# Patient Record
Sex: Female | Born: 1955
Health system: Southern US, Community
[De-identification: ages and names within clinical notes are randomized; demographics above are authoritative.]

## PROBLEM LIST (undated history)

## (undated) DIAGNOSIS — F419 Anxiety disorder, unspecified: Secondary | ICD-10-CM

## (undated) DIAGNOSIS — E559 Vitamin D deficiency, unspecified: Secondary | ICD-10-CM

## (undated) DIAGNOSIS — E039 Hypothyroidism, unspecified: Secondary | ICD-10-CM

## (undated) HISTORY — PX: WISDOM TOOTH EXTRACTION: SHX21

## (undated) HISTORY — DX: Anxiety disorder, unspecified: F41.9

---

## 1997-03-07 ENCOUNTER — Ambulatory Visit (HOSPITAL_COMMUNITY): Admission: RE | Admit: 1997-03-07 | Discharge: 1997-03-07 | Payer: Self-pay | Admitting: Obstetrics and Gynecology

## 1998-08-20 ENCOUNTER — Encounter: Payer: Self-pay | Admitting: Obstetrics and Gynecology

## 1998-08-20 ENCOUNTER — Ambulatory Visit (HOSPITAL_COMMUNITY): Admission: RE | Admit: 1998-08-20 | Discharge: 1998-08-20 | Payer: Self-pay | Admitting: Internal Medicine

## 1998-11-03 ENCOUNTER — Other Ambulatory Visit: Admission: RE | Admit: 1998-11-03 | Discharge: 1998-11-03 | Payer: Self-pay | Admitting: Obstetrics and Gynecology

## 2000-05-06 ENCOUNTER — Other Ambulatory Visit: Admission: RE | Admit: 2000-05-06 | Discharge: 2000-05-06 | Payer: Self-pay | Admitting: Obstetrics and Gynecology

## 2001-05-08 ENCOUNTER — Other Ambulatory Visit: Admission: RE | Admit: 2001-05-08 | Discharge: 2001-05-08 | Payer: Self-pay | Admitting: Obstetrics and Gynecology

## 2002-05-11 ENCOUNTER — Other Ambulatory Visit: Admission: RE | Admit: 2002-05-11 | Discharge: 2002-05-11 | Payer: Self-pay | Admitting: Obstetrics and Gynecology

## 2003-05-20 ENCOUNTER — Other Ambulatory Visit: Admission: RE | Admit: 2003-05-20 | Discharge: 2003-05-20 | Payer: Self-pay | Admitting: Obstetrics and Gynecology

## 2003-05-30 ENCOUNTER — Encounter: Admission: RE | Admit: 2003-05-30 | Discharge: 2003-05-30 | Payer: Self-pay | Admitting: Obstetrics and Gynecology

## 2004-07-16 ENCOUNTER — Other Ambulatory Visit: Admission: RE | Admit: 2004-07-16 | Discharge: 2004-07-16 | Payer: Self-pay | Admitting: Obstetrics and Gynecology

## 2010-01-11 HISTORY — PX: ENDOMETRIAL BIOPSY: SHX622

## 2010-10-07 ENCOUNTER — Ambulatory Visit (INDEPENDENT_AMBULATORY_CARE_PROVIDER_SITE_OTHER): Payer: PRIVATE HEALTH INSURANCE | Admitting: Internal Medicine

## 2010-10-07 ENCOUNTER — Encounter: Payer: Self-pay | Admitting: Internal Medicine

## 2010-10-07 DIAGNOSIS — E559 Vitamin D deficiency, unspecified: Secondary | ICD-10-CM | POA: Insufficient documentation

## 2010-10-07 DIAGNOSIS — E663 Overweight: Secondary | ICD-10-CM

## 2010-10-07 DIAGNOSIS — R5383 Other fatigue: Secondary | ICD-10-CM

## 2010-10-07 DIAGNOSIS — Z6825 Body mass index (BMI) 25.0-25.9, adult: Secondary | ICD-10-CM

## 2010-10-07 DIAGNOSIS — D649 Anemia, unspecified: Secondary | ICD-10-CM

## 2010-10-07 DIAGNOSIS — E669 Obesity, unspecified: Secondary | ICD-10-CM

## 2010-10-07 LAB — COMPREHENSIVE METABOLIC PANEL
ALT: 12 U/L (ref 0–35)
Albumin: 3.9 g/dL (ref 3.5–5.2)
CO2: 28 mEq/L (ref 19–32)
Calcium: 9.4 mg/dL (ref 8.4–10.5)
Chloride: 108 mEq/L (ref 96–112)
GFR: 65.6 mL/min (ref >60.00)
Glucose, Bld: 90 mg/dL (ref 70–99)
Potassium: 4.4 mEq/L (ref 3.5–5.1)
Sodium: 143 mEq/L (ref 135–145)
Total Bilirubin: 0.7 mg/dL (ref 0.3–1.2)
Total Protein: 7.4 g/dL (ref 6.0–8.3)

## 2010-10-07 LAB — CBC WITH DIFFERENTIAL/PLATELET
Basophils Absolute: 0.1 10*3/uL (ref 0.0–0.1)
Basophils Relative: 1.4 % (ref 0.0–3.0)
Eosinophils Absolute: 0.2 10*3/uL (ref 0.0–0.7)
Eosinophils Relative: 3.8 % (ref 0.0–5.0)
HCT: 39 % (ref 36.0–46.0)
Hemoglobin: 12.6 g/dL (ref 12.0–15.0)
Lymphocytes Relative: 31 % (ref 12.0–46.0)
Lymphs Abs: 1.7 10*3/uL (ref 0.7–4.0)
MCHC: 32.4 g/dL (ref 30.0–36.0)
MCV: 93.4 fl (ref 78.0–100.0)
Monocytes Absolute: 0.4 10*3/uL (ref 0.1–1.0)
Monocytes Relative: 7.8 % (ref 3.0–12.0)
Neutro Abs: 3.1 10*3/uL (ref 1.4–7.7)
Neutrophils Relative %: 56 % (ref 43.0–77.0)
Platelets: 239 10*3/uL (ref 150.0–400.0)
RBC: 4.18 Mil/uL (ref 3.87–5.11)
RDW: 14.1 % (ref 11.5–14.6)
WBC: 5.6 10*3/uL (ref 4.5–10.5)

## 2010-10-07 LAB — FERRITIN: Ferritin: 26.2 ng/mL (ref 10.0–291.0)

## 2010-10-07 LAB — IRON AND TIBC
%SAT: 21 % (ref 20–55)
Iron: 77 ug/dL (ref 42–145)
TIBC: 370 ug/dL (ref 250–470)
UIBC: 293 ug/dL (ref 125–400)

## 2010-10-07 LAB — VITAMIN B12: Vitamin B-12: 348 pg/mL (ref 211–911)

## 2010-10-07 LAB — LIPID PANEL: Cholesterol: 192 mg/dL (ref 0–200)

## 2010-10-07 LAB — TSH: TSH: 2.51 u[IU]/mL (ref 0.35–5.50)

## 2010-10-07 NOTE — Patient Instructions (Addendum)
Read about the Renato Shin Weight Loss method.  available at [facility]   We will call with the results of your labs

## 2010-10-07 NOTE — Progress Notes (Signed)
  Subjective:    Patient ID: Alicia Brady, female    DOB: 04-11-1955, 55 y.o.   MRN: [id number]  HPI  55 yo white female with no significant PMH presents to establish care .  She has no specific complaints today, but is interested in losing weight.   Past Medical History  Diagnosis Date  . Anxiety     managed with alprazolam  . Vitamin D deficiency     No current outpatient prescriptions on file prior to visit.    Review of Systems  Constitutional: Positive for fatigue. Negative for fever, chills and unexpected weight change.  HENT: Negative for hearing loss, ear pain, nosebleeds, congestion, sore throat, facial swelling, rhinorrhea, sneezing, mouth sores, trouble swallowing, neck pain, neck stiffness, voice change, postnasal drip, sinus pressure, tinnitus and ear discharge.   Eyes: Negative for pain, discharge, redness and visual disturbance.  Respiratory: Positive for chest tightness. Negative for cough, shortness of breath, wheezing and stridor.   Cardiovascular: Negative for chest pain, palpitations and leg swelling.  Gastrointestinal: Positive for constipation.  Genitourinary: Positive for enuresis.  Musculoskeletal: Negative for myalgias and arthralgias.  Skin: Negative for color change and rash.  Neurological: Negative for dizziness, weakness, light-headedness and headaches.  Hematological: Negative for adenopathy.  Psychiatric/Behavioral: Positive for sleep disturbance. The patient is nervous/anxious.   All other systems reviewed and are negative.       Objective:   Physical Exam  Constitutional: She is oriented to person, place, and time. She appears well-developed and well-nourished.  HENT:  Mouth/Throat: Oropharynx is clear and moist.  Eyes: EOM are normal. Pupils are equal, round, and reactive to light. No scleral icterus.  Neck: Normal range of motion. Neck supple. No JVD present. No thyromegaly present.  Cardiovascular: Normal rate, regular rhythm, normal  heart sounds and intact distal pulses.   Pulmonary/Chest: Effort normal and breath sounds normal.  Abdominal: Soft. Bowel sounds are normal. She exhibits no mass. There is no tenderness.  Musculoskeletal: Normal range of motion. She exhibits no edema.  Lymphadenopathy:    She has no cervical adenopathy.  Neurological: She is alert and oriented to person, place, and time.  Skin: Skin is warm and dry.  Psychiatric: She has a normal mood and affect.          Assessment & Plan:

## 2010-10-08 ENCOUNTER — Encounter: Payer: Self-pay | Admitting: Internal Medicine

## 2010-10-08 DIAGNOSIS — E669 Obesity, unspecified: Secondary | ICD-10-CM | POA: Insufficient documentation

## 2010-10-08 DIAGNOSIS — F419 Anxiety disorder, unspecified: Secondary | ICD-10-CM | POA: Insufficient documentation

## 2010-10-08 LAB — VITAMIN D 25 HYDROXY (VIT D DEFICIENCY, FRACTURES): Vit D, 25-Hydroxy: 30 ng/mL (ref 30–89)

## 2010-10-08 LAB — FOLATE RBC: RBC Folate: 664 ng/mL (ref >=366)

## 2010-10-08 NOTE — Assessment & Plan Note (Addendum)
Obesity: discussed fundamentals of weight loss inclluding carbohydrate control, exercise and frequent meals to avoid slowing of metabolism. Checking fasting labs and ruling out diabetes mellitus and hypothyroidism as contributors.

## 2010-10-09 ENCOUNTER — Encounter: Payer: Self-pay | Admitting: Internal Medicine

## 2010-11-16 ENCOUNTER — Telehealth: Payer: Self-pay | Admitting: *Deleted

## 2010-11-16 NOTE — Telephone Encounter (Signed)
Patient notified. Nurse visit scheduled for tomorrow for b12 injection.

## 2010-11-16 NOTE — Telephone Encounter (Signed)
Yes of course

## 2010-11-16 NOTE — Telephone Encounter (Signed)
Patient says that she is willing to pay out of pocket to have the b12 shots as discussed. She is asking if it still okay to start coming in for these. Please advise.

## 2010-11-17 ENCOUNTER — Ambulatory Visit (INDEPENDENT_AMBULATORY_CARE_PROVIDER_SITE_OTHER): Payer: Self-pay | Admitting: *Deleted

## 2010-11-17 DIAGNOSIS — E538 Deficiency of other specified B group vitamins: Secondary | ICD-10-CM

## 2010-11-17 MED ORDER — CYANOCOBALAMIN 1000 MCG/ML IJ SOLN
1000.0000 ug | Freq: Once | INTRAMUSCULAR | Status: AC
  Administered 2010-11-17: 1000 ug via INTRAMUSCULAR

## 2010-11-24 ENCOUNTER — Ambulatory Visit: Payer: PRIVATE HEALTH INSURANCE

## 2011-06-11 ENCOUNTER — Ambulatory Visit (INDEPENDENT_AMBULATORY_CARE_PROVIDER_SITE_OTHER): Payer: PRIVATE HEALTH INSURANCE | Admitting: Internal Medicine

## 2011-06-11 ENCOUNTER — Encounter: Payer: Self-pay | Admitting: Internal Medicine

## 2011-06-11 ENCOUNTER — Telehealth: Payer: Self-pay | Admitting: Internal Medicine

## 2011-06-11 VITALS — BP 108/68 | HR 62 | Temp 97.4°F | Resp 14 | Wt 148.5 lb

## 2011-06-11 DIAGNOSIS — Z1211 Encounter for screening for malignant neoplasm of colon: Secondary | ICD-10-CM

## 2011-06-11 DIAGNOSIS — E559 Vitamin D deficiency, unspecified: Secondary | ICD-10-CM

## 2011-06-11 DIAGNOSIS — R1013 Epigastric pain: Secondary | ICD-10-CM

## 2011-06-11 HISTORY — DX: Vitamin D deficiency, unspecified: E55.9

## 2011-06-11 LAB — CBC WITH DIFFERENTIAL/PLATELET
Basophils Absolute: 0.1 10*3/uL (ref 0.0–0.1)
Eosinophils Absolute: 0.2 10*3/uL (ref 0.0–0.7)
Lymphocytes Relative: 26.6 % (ref 12.0–46.0)
Lymphs Abs: 2.2 10*3/uL (ref 0.7–4.0)
MCHC: 32.8 g/dL (ref 30.0–36.0)
Monocytes Relative: 8.1 % (ref 3.0–12.0)
Platelets: 232 10*3/uL (ref 150.0–400.0)
RDW: 14 % (ref 11.5–14.6)

## 2011-06-11 LAB — COMPREHENSIVE METABOLIC PANEL
ALT: 9 U/L (ref 0–35)
AST: 21 U/L (ref 0–37)
CO2: 27 mEq/L (ref 19–32)
Chloride: 106 mEq/L (ref 96–112)
Creatinine, Ser: 0.8 mg/dL (ref 0.4–1.2)
GFR: 78.83 mL/min (ref >60.00)
Sodium: 140 mEq/L (ref 135–145)
Total Bilirubin: 0.7 mg/dL (ref 0.3–1.2)
Total Protein: 7.1 g/dL (ref 6.0–8.3)

## 2011-06-11 LAB — TSH: TSH: 1.81 u[IU]/mL (ref 0.35–5.50)

## 2011-06-11 MED ORDER — DEXLANSOPRAZOLE 60 MG PO CPDR: 60.0000 mg | DELAYED_RELEASE_CAPSULE | Freq: Every day | ORAL | Status: DC

## 2011-06-11 NOTE — Assessment & Plan Note (Signed)
Exam today is most consistent with gastritis versus ulcer. Will treat with Dexilant to see if any improvement in symptoms. We'll also send lab work including CBC, CMP, TSH, and testing for H. pylori. Will send stool for occult blood testing. Patient refuses referral for colonoscopy. She will followup next week. If symptoms are persisting, would favor getting imaging, CT abdomen.

## 2011-06-11 NOTE — Assessment & Plan Note (Signed)
Patient declines referral for colonoscopy, will send stool occult blood testing today.

## 2011-06-11 NOTE — Telephone Encounter (Signed)
Caller: Alicia Brady/Patient; PCP: Duncan Dull; CB#: [phone]; ; ; Call regarding Abdominal Bloating; onset 1 week ago.  States her gassiness has been constant the entire week, and she is bloated.  Gas-X is not helpful in reducing the bloating.  States her BMs are normal.  Afebrile.  Denies nausea/vomiting.  Per protocol, emergent symptoms denied; advised appt within 72 hours.   Appt sched 06/11/11 1330 with Dr. Dan Humphreys.

## 2011-06-11 NOTE — Progress Notes (Signed)
Subjective:    Patient ID: Alicia Brady, female    DOB: 04-21-1955, 56 y.o.   MRN: [id number]  HPI 56 year old female presents for acute visit complaining of a one-week history of abdominal distention, bloating, and epigastric abdominal pain. She reports that symptoms first began after eating fresh strawberries. She initially attributed her symptoms to this, however but symptoms persisted. She denies any nausea, vomiting, diarrhea, constipation, blood in her stool. She chronically has infrequent bowel movements, typically every other day or less. She had a regular bowel movement today. She denies any fever or chills. She denies shortness of breath, chest pain, or other symptoms. She has taken some simethicone for this with minimal improvement.  Outpatient Encounter Prescriptions as of 06/11/2011  Medication Sig Dispense Refill  . ALPRAZolam (XANAX) 0.25 MG tablet Take 0.25 mg by mouth 3 (three) times daily as needed.        . phentermine (ADIPEX-P) 37.5 MG tablet Take 18.7 mg by mouth as needed.      . Simethicone (GAS-X PO) Take by mouth as needed.      Marland Kitchen DISCONTD: buPROPion (WELLBUTRIN XL) 300 MG 24 hr tablet Take 300 mg by mouth daily.        Marland Kitchen dexlansoprazole (DEXILANT) 60 MG capsule Take 1 capsule (60 mg total) by mouth daily.  30 capsule  3    Review of Systems  Constitutional: Negative for fever, chills, appetite change, fatigue and unexpected weight change.  HENT: Negative for ear pain, congestion, sore throat, trouble swallowing, neck pain, voice change and sinus pressure.   Eyes: Negative for visual disturbance.  Respiratory: Negative for cough, shortness of breath, wheezing and stridor.   Cardiovascular: Negative for chest pain, palpitations and leg swelling.  Gastrointestinal: Positive for abdominal pain and abdominal distention. Negative for nausea, vomiting, diarrhea, constipation, blood in stool and anal bleeding.  Genitourinary: Negative for dysuria and flank pain.    Musculoskeletal: Negative for myalgias, arthralgias and gait problem.  Skin: Negative for color change and rash.  Neurological: Negative for dizziness and headaches.  Hematological: Negative for adenopathy. Does not bruise/bleed easily.  Psychiatric/Behavioral: Negative for suicidal ideas, sleep disturbance and dysphoric mood. The patient is not nervous/anxious.    BP 108/68  Pulse 62  Temp(Src) 97.4 F (36.3 C) (Oral)  Resp 14  Wt 148 lb 8 oz (67.359 kg)  SpO2 100%     Objective:   Physical Exam  Constitutional: She is oriented to person, place, and time. She appears well-developed and well-nourished. No distress.  HENT:  Head: Normocephalic and atraumatic.  Right Ear: External ear normal.  Left Ear: External ear normal.  Nose: Nose normal.  Mouth/Throat: Oropharynx is clear and moist. No oropharyngeal exudate.  Eyes: Conjunctivae are normal. Pupils are equal, round, and reactive to light. Right eye exhibits no discharge. Left eye exhibits no discharge. No scleral icterus.  Neck: Normal range of motion. Neck supple. No tracheal deviation present. No thyromegaly present.  Cardiovascular: Normal rate, regular rhythm, normal heart sounds and intact distal pulses.  Exam reveals no gallop and no friction rub.   No murmur heard. Pulmonary/Chest: Effort normal and breath sounds normal. No respiratory distress. She has no wheezes. She has no rales. She exhibits no tenderness.  Abdominal: Soft. Bowel sounds are normal. She exhibits no distension and no mass. There is tenderness (epigastric). There is no rebound and no guarding.  Musculoskeletal: Normal range of motion. She exhibits no edema and no tenderness.  Lymphadenopathy:    She has  no cervical adenopathy.  Neurological: She is alert and oriented to person, place, and time. No cranial nerve deficit. She exhibits normal muscle tone. Coordination normal.  Skin: Skin is warm and dry. No rash noted. She is not diaphoretic. No erythema.  No pallor.  Psychiatric: She has a normal mood and affect. Her behavior is normal. Judgment and thought content normal.          Assessment & Plan:

## 2011-06-11 NOTE — Assessment & Plan Note (Signed)
History of vitamin D deficiency in the past, will check level with labs today.

## 2011-06-12 LAB — VITAMIN D 25 HYDROXY (VIT D DEFICIENCY, FRACTURES): Vit D, 25-Hydroxy: 23 ng/mL — ABNORMAL LOW (ref 30–89)

## 2011-06-16 ENCOUNTER — Encounter: Payer: Self-pay | Admitting: Internal Medicine

## 2011-06-18 ENCOUNTER — Encounter: Payer: Self-pay | Admitting: Internal Medicine

## 2011-06-18 ENCOUNTER — Ambulatory Visit (INDEPENDENT_AMBULATORY_CARE_PROVIDER_SITE_OTHER): Payer: PRIVATE HEALTH INSURANCE | Admitting: Internal Medicine

## 2011-06-18 VITALS — BP 100/64 | HR 65 | Temp 98.2°F | Resp 16 | Wt 147.8 lb

## 2011-06-18 DIAGNOSIS — R1013 Epigastric pain: Secondary | ICD-10-CM

## 2011-06-18 DIAGNOSIS — E663 Overweight: Secondary | ICD-10-CM | POA: Insufficient documentation

## 2011-06-18 MED ORDER — ERGOCALCIFEROL 1.25 MG (50000 UT) PO CAPS: 50000.0000 [IU] | ORAL_CAPSULE | ORAL | Status: AC

## 2011-06-18 NOTE — Patient Instructions (Signed)
Consider the Low Glycemic Index Diet and 6 smaller meals daily .  This boosts your metabolism and regulates your sugars:   7 AM Low carbohydrate Protein  Shakes (EAS Carb Control  Or Atkins ,  Available everywhere,   In  cases at [employer] )  2.5 carbs  (Add or substitute a toasted sandwhich thin w/ peanut butter)  10 AM: Protein bar by Atkins (snack size,  Chocolate lover's variety at  [employer])    Lunch: sandwich on pita bread or flatbread (Joseph's makes a pita bread and a flat bread , available at [employer] and [employer]; Toufayah makes a low carb flatbread available at [employer] and [employer]) Mission makes a low carb whole wheat tortilla available at [employer] most grocery stores   3 PM:  Mid day :  Another protein bar,  Or a  cheese stick, 1/4 cup of almonds, walnuts, pistachios, pecans, peanuts,  Macadamia nuts  6 PM  Dinner:  "mean and green:"  Meat/chicken/fish, salad, and green veggie : use ranch, vinagrette,  Blue cheese, etc  9 PM snack : Breyer's low carb fudgsicle or  ice cream bar (Carb Smart), or  Weight Watcher's ice cream bar , or another protein shake  Try Dannon greek Yogurt, it is low carb and low cal .  google the phrase Low GI foods to get lists of acceptable substitutions  Stay on the stomach medicine for a total of 4 weeks.

## 2011-06-18 NOTE — Progress Notes (Addendum)
Patient ID: GENEVIE ELMAN, female   DOB: January 24, 1955, 56 y.o.   MRN: [id number]  Patient Active Problem List  Diagnosis  . Anxiety  . Abdominal pain, epigastric  . Vitamin d deficiency  . Screening for colon cancer  . Overweight (BMI 25.0-29.9)    Subjective:  CC:   Chief Complaint  Patient presents with  . Follow-up    HPI:   MAKINA SKOW a 56 y.o. female who presents for one week follow up on prior evaluation of abdominal pain.  She was treated with a PPI added to her simethicone  week ago by Dr. Dan Humphreys with improvement of symptomsoOf abdominal pain and bloating .  She denies, fevers, nausea and diarrhea.  Serologies were all normal and h Pylori was negative.    Past Medical History  Diagnosis Date  . Vitamin d deficiency   . Anxiety     managed with alprazolam  . Obesity (BMI 30-39.9)     Past Surgical History  Procedure Date  . Endometrial biopsy Jan 2012    for spotting, biopsy normal         The following portions of the patient's history were reviewed and updated as appropriate: Allergies, current medications, and problem list.    Review of Systems:   12 Pt  review of systems was negative except those addressed in the HPI,     History   Social History  . Marital Status: Married    Spouse Name: N/A    Number of Children: N/A  . Years of Education: N/A   Occupational History  . Not on file.   Social History Main Topics  . Smoking status: Never Smoker   . Smokeless tobacco: Never Used  . Alcohol Use: No  . Drug Use: No  . Sexually Active: Not on file   Other Topics Concern  . Not on file   Social History Narrative  . No narrative on file    Objective:  BP 100/64  Pulse 65  Temp 98.2 F (36.8 C) (Oral)  Resp 16  Wt 147 lb 12 oz (67.019 kg)  SpO2 98%  General appearance: alert, cooperative and appears stated age Ears: normal TM's and external ear canals both ears Throat: lips, mucosa, and tongue normal; teeth and gums  normal Neck: no adenopathy, no carotid bruit, supple, symmetrical, trachea midline and thyroid not enlarged, symmetric, no tenderness/mass/nodules Back: symmetric, no curvature. ROM normal. No CVA tenderness. Lungs: clear to auscultation bilaterally Heart: regular rate and rhythm, S1, S2 normal, no murmur, click, rub or gallop Abdomen: soft, non-tender; bowel sounds normal; no masses,  no organomegaly Pulses: 2+ and symmetric Skin: Skin color, texture, turgor normal. No rashes or lesions Lymph nodes: Cervical, supraclavicular, and axillary nodes normal.  Assessment and Plan  Abdominal pain, epigastric Gastritis and dyspepsia occurred secondary to ingestion of trawberries .  FOBT was negative, as was H  Pylori serology. Her symptoms improved with trial of PPI.  She was advised to continue the PPI for a total of four weeks before discontinuing it.   Overweight (BMI 25.0-29.9) I have addressed  BMI and recommended a low glycemic index diet utilizing smaller more frequent meals to increase metabolism.  I have also recommended that patient start exercising with a goal of 30 minutes of aerobic exercise a minimum of 5 days per week. Screening for lipid disorders, thyroid and diabetes has been done.     Updated Medication List Outpatient Encounter Prescriptions as of 06/18/2011  Medication Sig  Dispense Refill  . ALPRAZolam (XANAX) 0.25 MG tablet Take 0.25 mg by mouth 3 (three) times daily as needed.        Marland Kitchen dexlansoprazole (DEXILANT) 60 MG capsule Take 1 capsule (60 mg total) by mouth daily.  30 capsule  3  . Simethicone (GAS-X PO) Take by mouth as needed.      . ergocalciferol (DRISDOL) 50000 UNITS capsule Take 1 capsule (50,000 Units total) by mouth once a week.  4 capsule  0  . DISCONTD: phentermine (ADIPEX-P) 37.5 MG tablet Take 18.7 mg by mouth as needed.

## 2011-06-21 ENCOUNTER — Other Ambulatory Visit: Payer: Self-pay | Admitting: Internal Medicine

## 2011-06-21 DIAGNOSIS — Z1211 Encounter for screening for malignant neoplasm of colon: Secondary | ICD-10-CM

## 2011-06-22 ENCOUNTER — Other Ambulatory Visit: Payer: PRIVATE HEALTH INSURANCE

## 2011-06-22 DIAGNOSIS — Z1211 Encounter for screening for malignant neoplasm of colon: Secondary | ICD-10-CM

## 2011-06-22 LAB — FECAL OCCULT BLOOD, IMMUNOCHEMICAL: Fecal Occult Bld: NEGATIVE

## 2011-06-27 ENCOUNTER — Encounter: Payer: Self-pay | Admitting: Internal Medicine

## 2011-06-27 NOTE — Assessment & Plan Note (Signed)
I have addressed  BMI and recommended a low glycemic index diet utilizing smaller more frequent meals to increase metabolism.  I have also recommended that patient start exercising with a goal of 30 minutes of aerobic exercise a minimum of 5 days per week. Screening for lipid disorders, thyroid and diabetes has  been done  °

## 2011-06-27 NOTE — Assessment & Plan Note (Addendum)
Gastritis and dyspepsia occurred secondary to ingestion of trawberries .  FOBT was negative, as was H  Pylori serology. Her symptoms improved with trial of PPI.  She was advised to continue the PPI for a total of four weeks before discontinuing it.

## 2011-08-10 ENCOUNTER — Encounter: Payer: Self-pay | Admitting: Internal Medicine

## 2011-08-24 ENCOUNTER — Other Ambulatory Visit: Payer: Self-pay | Admitting: Internal Medicine

## 2012-01-24 ENCOUNTER — Telehealth: Payer: Self-pay | Admitting: Internal Medicine

## 2012-01-24 ENCOUNTER — Ambulatory Visit (INDEPENDENT_AMBULATORY_CARE_PROVIDER_SITE_OTHER): Payer: PRIVATE HEALTH INSURANCE | Admitting: Internal Medicine

## 2012-01-24 ENCOUNTER — Encounter: Payer: Self-pay | Admitting: Internal Medicine

## 2012-01-24 VITALS — BP 120/78 | HR 65 | Temp 97.8°F | Resp 16 | Wt 143.2 lb

## 2012-01-24 DIAGNOSIS — J069 Acute upper respiratory infection, unspecified: Secondary | ICD-10-CM | POA: Insufficient documentation

## 2012-01-24 MED ORDER — PREDNISONE (PAK) 10 MG PO TABS: ORAL_TABLET | ORAL | Status: DC

## 2012-01-24 MED ORDER — BENZONATATE 200 MG PO CAPS: 200.0000 mg | ORAL_CAPSULE | Freq: Three times a day (TID) | ORAL | Status: DC | PRN

## 2012-01-24 MED ORDER — LEVOFLOXACIN 500 MG PO TABS: 500.0000 mg | ORAL_TABLET | Freq: Every day | ORAL | Status: DC

## 2012-01-24 NOTE — Progress Notes (Signed)
Patient ID: Alicia Brady, female   DOB: January 29, 1955, 57 y.o.   MRN: [id number]  Patient Active Problem List  Diagnosis  . Anxiety  . Abdominal pain, epigastric  . Vitamin d deficiency  . Screening for colon cancer  . Overweight (BMI 25.0-29.9)  . Acute URI    Subjective:  CC:   Chief Complaint  Patient presents with  . Cough    productive    HPI:   CHARMEL Brady a 57 y.o. female who presents 6 day history of cough .  Fevers started 5 days ago,  Treated with tylenol  Fevers and malaise, spent the weekend in bed.  tmax was 100.8 all weekend.  Cough is incessant ,  productive of scant amounts of clear sputum.     Past Medical History  Diagnosis Date  . Vitamin D deficiency   . Anxiety     managed with alprazolam  . Obesity (BMI 30-39.9)     Past Surgical History  Procedure Date  . Endometrial biopsy Jan 2012    for spotting, biopsy normal         The following portions of the patient's history were reviewed and updated as appropriate: Allergies, current medications, and problem list.    Review of Systems:   12 Pt  review of systems was negative except those addressed in the HPI,     History   Social History  . Marital Status: Married    Spouse Name: N/A    Number of Children: N/A  . Years of Education: N/A   Occupational History  . Not on file.   Social History Main Topics  . Smoking status: Never Smoker   . Smokeless tobacco: Never Used  . Alcohol Use: No  . Drug Use: No  . Sexually Active: Not on file   Other Topics Concern  . Not on file   Social History Narrative  . No narrative on file    Objective:  BP 120/78  Pulse 65  Temp 97.8 F (36.6 C) (Oral)  Resp 16  Wt 143 lb 4 oz (64.978 kg)  SpO2 98%  General appearance: alert, cooperative and appears stated age Ears: bilateral injected  TM's and external ear canals both ears Throat: lips, mucosa, and tongue normal; teeth and gums normal. Tonsillar erythema Sinuses:  tender maxillary sinuses  Neck: no adenopathy, no carotid bruit, supple, symmetrical, trachea midline and thyroid not enlarged, symmetric, no tenderness/mass/nodules Back: symmetric, no curvature. ROM normal. No CVA tenderness. Lungs: clear to auscultation bilaterally Heart: regular rate and rhythm, S1, S2 normal, no murmur, click, rub or gallop Abdomen: soft, non-tender; bowel sounds normal; no masses,  no organomegaly Pulses: 2+ and symmetric Skin: Skin color, texture, turgor normal. No rashes or lesions Lymph nodes: Cervical, supraclavicular, and axillary nodes normal.  Assessment and Plan:  Acute URI Empiric abx, steroids and decongestants.  Out of the window for tamiflu   Updated Medication List Outpatient Encounter Prescriptions as of 01/24/2012  Medication Sig Dispense Refill  . ALPRAZolam (XANAX) 0.25 MG tablet Take 0.25 mg by mouth 3 (three) times daily as needed.        . Simethicone (GAS-X PO) Take by mouth as needed.      . benzonatate (TESSALON) 200 MG capsule Take 1 capsule (200 mg total) by mouth 3 (three) times daily as needed for cough.  20 capsule  0  . dexlansoprazole (DEXILANT) 60 MG capsule Take 1 capsule (60 mg total) by mouth daily.  30 capsule  3  . ergocalciferol (DRISDOL) 50000 UNITS capsule Take 1 capsule (50,000 Units total) by mouth once a week.  4 capsule  0  . levofloxacin (LEVAQUIN) 500 MG tablet Take 1 tablet (500 mg total) by mouth daily.  7 tablet  0  . predniSONE (STERAPRED UNI-PAK) 10 MG tablet 6 tablets on Day 1 , then reduce by 1 tablet daily until gone  21 tablet  0     No orders of the defined types were placed in this encounter.    No Follow-up on file.

## 2012-01-24 NOTE — Patient Instructions (Addendum)
You have a sinus/ear infection/bronchitis   .  I am prescribing an antibiotic (levaquin ) and prednisone taper  To manage the infection and the inflammation in your ear/sinuses.   I also advise use of the following OTC meds to help with your other symptoms.   You can use generic OTC benadryl 25 mg every 8 hours for the drainage,  Sudafed PE  10 to 30 mg every 8 hours for the congestion, you may substitute Afrin nasal spray for the nighttime dose of sudafed PE  If needed to prevent insomnia.  flushes your sinuses twice daily with Simply Saline (do over the sink because if you do it right you will spit out globs of mucus)  Use benzonatate capsules    FOR THE COUGH.  Gargle with salt water as needed for sore throat.

## 2012-01-24 NOTE — Assessment & Plan Note (Signed)
Empiric abx, steroids and decongestants.  Out of the window for tamiflu

## 2012-01-24 NOTE — Telephone Encounter (Signed)
Patient Information:  Caller Name: Sande  Phone: [phone]  Patient: Alicia Brady, Alicia Brady  Gender: Female  DOB: 08-Jun-1955  Age: 57 Years  PCP: Duncan Dull (Adults only)  Office Follow Up:  Does the office need to follow up with this patient?: No  Instructions For The Office: N/A   Symptoms  Reason For Call & Symptoms: body aches, fever and cough  Reviewed Health History In EMR: Yes  Reviewed Medications In EMR: Yes  Reviewed Allergies In EMR: Yes  Reviewed Surgeries / Procedures: Yes  Date of Onset of Symptoms: 01/20/2012  Treatments Tried: Tylenol  Treatments Tried Worked: No  Any Fever: Yes  Fever Taken: Oral  Fever Time Of Reading: 08:00:00  Fever Last Reading: 100.8  Guideline(s) Used:  Cough  Disposition Per Guideline:   See Today in Office  Reason For Disposition Reached:   Fever present > 3 days (72 hours)  Advice Given:  Cough Medicines:  OTC Cough Syrups: The most common cough suppressant in OTC cough medications is dextromethorphan. Often the letters "DM" appear in the name.  Home Remedy - Honey: This old home remedy has been shown to help decrease coughing at night. The adult dosage is 2 teaspoons (10 ml) at bedtime. Honey should not be given to infants under one year of age.  Coughing Spasms:  Drink warm fluids. Inhale warm mist (Reason: both relax the airway and loosen up the phlegm).  Prevent Dehydration:  Drink adequate liquids.  Fever Medicines:  Acetaminophen (e.g., Tylenol):  Regular Strength Tylenol: Take 650 mg (two 325 mg pills) by mouth every 4-6 hours as needed. Each Regular Strength Tylenol pill has 325 mg of acetaminophen.  Extra Strength Tylenol: Take 1,000 mg (two 500 mg pills) every 8 hours as needed. Each Extra Strength Tylenol pill has 500 mg of acetaminophen.  Call Back If:  You become worse.  Appointment Scheduled:  01/24/2012 09:30:00 Appointment Scheduled Provider:  Duncan Dull (Adults only)

## 2012-01-31 ENCOUNTER — Ambulatory Visit (INDEPENDENT_AMBULATORY_CARE_PROVIDER_SITE_OTHER): Payer: PRIVATE HEALTH INSURANCE | Admitting: Adult Health

## 2012-01-31 ENCOUNTER — Telehealth: Payer: Self-pay | Admitting: Internal Medicine

## 2012-01-31 ENCOUNTER — Encounter: Payer: Self-pay | Admitting: Adult Health

## 2012-01-31 VITALS — BP 105/67 | HR 68 | Temp 98.2°F | Resp 16 | Ht 60.0 in | Wt 142.0 lb

## 2012-01-31 DIAGNOSIS — R5381 Other malaise: Secondary | ICD-10-CM

## 2012-01-31 DIAGNOSIS — R5383 Other fatigue: Secondary | ICD-10-CM

## 2012-01-31 NOTE — Telephone Encounter (Signed)
Patient Information:  Caller Name: Alicia Brady  Phone: [phone]  Patient: Alicia Brady, Alicia Brady  Gender: Female  DOB: 1955/03/31  Age: 57 Years  PCP: Duncan Dull (Adults only)  Office Follow Up:  Does the office need to follow up with this patient?: No  Instructions For The Office: N/A  RN Note:  pt also reports dizziness and some nausea  Symptoms  Reason For Call & Symptoms: pt was seen in the office and given Levaquin and Prednisone for sinus, ears and bronchitis.  Pt states that she is still feeling exhausted  Reviewed Health History In EMR: Yes  Reviewed Medications In EMR: Yes  Reviewed Allergies In EMR: Yes  Reviewed Surgeries / Procedures: Yes  Date of Onset of Symptoms: 01/24/2012  Guideline(s) Used:  Dizziness  Disposition Per Guideline:   Go to Office Now  Reason For Disposition Reached:   Lightheadedness (dizziness) present now, after 2 hours of rest and fluids  Advice Given:  Drink Fluids:  Drink several glasses of fruit juice, other clear fluids, or water. This will improve hydration and blood glucose. If you have a fever or have had heat exposure, make sure the fluids are cold.  Call Back If:  Passes out (faints)  You become worse.  Appointment Scheduled:  01/31/2012 15:30:00 Appointment Scheduled Provider:  Orville Govern  (no appt found for Dr Darrick Huntsman; first appt time i could schedule was this appt)

## 2012-01-31 NOTE — Patient Instructions (Addendum)
  Please have your labs drawn prior to leaving the office.   We will notify you of your results once they are available.  Start Albuterol inhaler 1-2 puffs every 4-6 hours as needed for wheezing, shortness of breath.  Please call if you do not feel any better within the next 3-4 days.

## 2012-01-31 NOTE — Assessment & Plan Note (Signed)
Fatigue is common with URI, influenza or other illness and patient just finished course of antibiotic and prednisone taper. Will check cbc, b12 to r/o other causes of her fatigue. Recent TSH normal.

## 2012-01-31 NOTE — Progress Notes (Signed)
  Subjective:    Patient ID: Alicia Brady, female    DOB: April 29, 1955, 57 y.o.   MRN: [id number]  HPI  Patient is a pleasant 57 y/o female who presents to clinic for f/u of recent tx for URI. She had presented with a 6 day hx of cough, fever and was started on empiric antibiotic, steroids and cough medication. She reports feeling improved from her symptoms but extremely fatigued. She describes that she felt she was going to "pass out" while in the shower the other day. Her appetite has been diminished throughout this episode. Denies shortness of breath or chest pain. She completed her antibiotic (Levaquin) yesterday as well as her prednisone taper.   Current Outpatient Prescriptions on File Prior to Visit  Medication Sig Dispense Refill  . benzonatate (TESSALON) 200 MG capsule Take 1 capsule (200 mg total) by mouth 3 (three) times daily as needed for cough.  20 capsule  0  . ALPRAZolam (XANAX) 0.25 MG tablet Take 0.25 mg by mouth 3 (three) times daily as needed.        Marland Kitchen dexlansoprazole (DEXILANT) 60 MG capsule Take 1 capsule (60 mg total) by mouth daily.  30 capsule  3  . ergocalciferol (DRISDOL) 50000 UNITS capsule Take 1 capsule (50,000 Units total) by mouth once a week.  4 capsule  0  . Simethicone (GAS-X PO) Take by mouth as needed.         Review of Systems  Constitutional: Positive for activity change, appetite change and fatigue.  HENT: Negative for congestion, sore throat, rhinorrhea, postnasal drip and sinus pressure.   Eyes: Negative.   Respiratory: Positive for cough. Negative for chest tightness, shortness of breath and wheezing.   Cardiovascular: Negative for chest pain, palpitations and leg swelling.  Gastrointestinal: Negative.   Genitourinary: Negative.   Skin: Negative.   Neurological: Positive for weakness and light-headedness. Negative for dizziness, tremors, numbness and headaches.  Psychiatric/Behavioral: Negative.     BP 105/67  Pulse 68  Temp 98.2 F (36.8  C) (Oral)  Resp 16  Ht 5' (1.524 m)  Wt 142 lb (64.411 kg)  BMI 27.73 kg/m2  SpO2 93%     Objective:   Physical Exam  Constitutional: She is oriented to person, place, and time. She appears well-developed and well-nourished. No distress.  HENT:  Head: Normocephalic and atraumatic.  Right Ear: External ear normal.  Left Ear: External ear normal.  Nose: Nose normal.  Mouth/Throat: Oropharynx is clear and moist. No oropharyngeal exudate.  Neck: Normal range of motion. Neck supple. No tracheal deviation present.  Cardiovascular: Normal rate, regular rhythm and normal heart sounds.  Exam reveals no gallop and no friction rub.   No murmur heard. Pulmonary/Chest: Effort normal and breath sounds normal. No respiratory distress. She has no wheezes. She has no rales.       Congested cough observed.  Abdominal: Soft. Bowel sounds are normal.  Musculoskeletal: Normal range of motion. She exhibits no edema.  Lymphadenopathy:    She has no cervical adenopathy.  Neurological: She is alert and oriented to person, place, and time.  Skin: Skin is warm and dry.  Psychiatric: She has a normal mood and affect. Her behavior is normal. Judgment and thought content normal.       Assessment & Plan:

## 2012-02-01 ENCOUNTER — Encounter: Payer: Self-pay | Admitting: Adult Health

## 2012-02-01 LAB — CBC WITH DIFFERENTIAL/PLATELET
Basophils Absolute: 0 10*3/uL (ref 0.0–0.1)
Eosinophils Absolute: 0 10*3/uL (ref 0.0–0.7)
Hemoglobin: 12.6 g/dL (ref 12.0–15.0)
Lymphocytes Relative: 27.9 % (ref 12.0–46.0)
MCHC: 33.5 g/dL (ref 30.0–36.0)
Monocytes Relative: 10.9 % (ref 3.0–12.0)
Neutrophils Relative %: 60.4 % (ref 43.0–77.0)
RDW: 14.2 % (ref 11.5–14.6)

## 2012-02-01 LAB — VITAMIN B12: Vitamin B-12: 698 pg/mL (ref 211–911)

## 2012-02-26 ENCOUNTER — Other Ambulatory Visit: Payer: Self-pay

## 2012-11-16 ENCOUNTER — Other Ambulatory Visit: Payer: Self-pay

## 2013-05-25 ENCOUNTER — Ambulatory Visit: Payer: PRIVATE HEALTH INSURANCE | Admitting: Internal Medicine

## 2013-05-25 ENCOUNTER — Encounter: Payer: Self-pay | Admitting: Internal Medicine

## 2013-05-25 ENCOUNTER — Ambulatory Visit (INDEPENDENT_AMBULATORY_CARE_PROVIDER_SITE_OTHER): Payer: PRIVATE HEALTH INSURANCE | Admitting: Internal Medicine

## 2013-05-25 VITALS — BP 100/68 | HR 69 | Temp 98.3°F | Resp 18 | Wt 129.8 lb

## 2013-05-25 DIAGNOSIS — J069 Acute upper respiratory infection, unspecified: Secondary | ICD-10-CM

## 2013-05-25 MED ORDER — PREDNISONE (PAK) 10 MG PO TABS: ORAL_TABLET | ORAL | Status: DC

## 2013-05-25 MED ORDER — AMOXICILLIN-POT CLAVULANATE 875-125 MG PO TABS: 1.0000 | ORAL_TABLET | Freq: Two times a day (BID) | ORAL | Status: DC

## 2013-05-25 NOTE — Progress Notes (Signed)
Patient ID: Alicia Brady, female   DOB: 08/13/1955, 58 y.o.   MRN: [id number]

## 2013-05-25 NOTE — Patient Instructions (Addendum)
You have a viral  Syndrome .  The post nasal drip is causing your sore throat and cough .  Flush your sinuses twice daily with Simply saline nasal spray.  Use benadryl 25 mg every 8 hours for the post nasal drip and Afrin nasal spray every 12 hours  If needed for persistent  Sinus congestion.  Mucinex DM will help thin ou the mucus and suppress the cough  I will call in prednisone  In a 6 day tapering dose to help knock down the inflammation  Gargle with salt water as needed for the sore throat.   If the throat is no better  In 48 hours  OR  if you develop T > 100.4,  Green or bloody  nasal discharge,  Or facial pain,  You can start the amoxicillin. Please take a probiotic ( Align, Floraque or Culturelle) while you are on the antibiotic to prevent a serious antibiotic associated diarrhea  Called clostirudium dificile colitis and a vaginal yeast infection .

## 2013-05-25 NOTE — Assessment & Plan Note (Addendum)
No signs of bacterial sinusitis or otitis today.This URI is most likely viral .  I have explained that in viral URIS, an antibiotic will not help the symptoms and will increase the risk of developing diarrhea.,  Continue oral and nasal decongestants, perdnisone taper for 6 days and tylenol 650 mq 8 hrs for aches and pains,   Add abx only if symptoms worsen to include fevers, facial pain, purulent sputum./drainage.

## 2013-05-25 NOTE — Progress Notes (Signed)
Pre-visit discussion using our clinic review tool. No additional management support is needed unless otherwise documented below in the visit note.  

## 2013-05-27 ENCOUNTER — Encounter: Payer: Self-pay | Admitting: Internal Medicine

## 2013-10-26 ENCOUNTER — Other Ambulatory Visit: Payer: Self-pay

## 2014-02-14 ENCOUNTER — Ambulatory Visit (INDEPENDENT_AMBULATORY_CARE_PROVIDER_SITE_OTHER): Payer: Commercial Managed Care - PPO | Admitting: Nurse Practitioner

## 2014-02-14 ENCOUNTER — Encounter: Payer: Self-pay | Admitting: Nurse Practitioner

## 2014-02-14 VITALS — BP 120/76 | HR 76 | Temp 97.7°F | Resp 12 | Ht 60.0 in | Wt 134.8 lb

## 2014-02-14 DIAGNOSIS — H01119 Allergic dermatitis of unspecified eye, unspecified eyelid: Secondary | ICD-10-CM

## 2014-02-14 NOTE — Assessment & Plan Note (Signed)
Worsening. Will check ANA and ESR for systemic causes. Encouraged pt to still use Systane and Desonide on skin. Will FU in approx 1 week. Asked her to call if changes.

## 2014-02-14 NOTE — Progress Notes (Signed)
Subjective:    Patient ID: Alicia Brady, female    DOB: 31-May-1955, 59 y.o.   MRN: [id number]  HPI  [name] is a 59 yo female with a CC of rash on eyelids x 1 day (rash on right x 1 month).   Went to dermatologist yesterday.   Desonide ointment- once last night, burning Eye Dr. Woodfin Ganja her out and no eye issues visualized.   Tobradex- soothing  Started in Dec. Tried to rule out on her own  Right started first- red, no sensations   Systane drops- soothed  Last night left eye became involved   2 benadryl-helpful   Morning right eye would run (watery)  No recent travel  No change to make up  No change to detergent No joint pain, no pain on urination No new pets  No change in nutritional supplements   Review of Systems  Constitutional: Negative for fever, chills, diaphoresis and fatigue.  Respiratory: Negative for chest tightness, shortness of breath and wheezing.   Cardiovascular: Negative for chest pain, palpitations and leg swelling.  Gastrointestinal: Negative for nausea, vomiting, diarrhea and rectal pain.  Skin: Positive for rash.  Neurological: Negative for dizziness, weakness, numbness and headaches.  Psychiatric/Behavioral: The patient is not nervous/anxious.    Past Medical History  Diagnosis Date  . Vitamin D deficiency   . Anxiety     managed with alprazolam  . Obesity (BMI 30-39.9)     History   Social History  . Marital Status: Married    Spouse Name: N/A    Number of Children: N/A  . Years of Education: N/A   Occupational History  . Not on file.   Social History Main Topics  . Smoking status: Never Smoker   . Smokeless tobacco: Never Used  . Alcohol Use: No  . Drug Use: No  . Sexual Activity: Not on file   Other Topics Concern  . Not on file   Social History Narrative    Past Surgical History  Procedure Laterality Date  . Endometrial biopsy  Jan 2012    for spotting, biopsy normal    Family History  Problem Relation Age  of Onset  . Alcohol abuse Mother   . Diabetes Father   . Heart disease Father   . Heart disease Sister     Allergies  Allergen Reactions  . Levaquin [Levofloxacin] Nausea Only    Dizziness    No current outpatient prescriptions on file prior to visit.   No current facility-administered medications on file prior to visit.       Objective:   Physical Exam  Constitutional: She is oriented to person, place, and time. She appears well-developed and well-nourished. No distress.  BP 120/76 mmHg  Pulse 76  Temp(Src) 97.7 F (36.5 C) (Oral)  Resp 12  Ht 5' (1.524 m)  Wt 134 lb 12.8 oz (61.145 kg)  BMI 26.33 kg/m2  SpO2 99%   HENT:  Head: Normocephalic and atraumatic.  Right Ear: External ear normal.  Left Ear: External ear normal.  Eyes: Conjunctivae, EOM and lids are normal. Pupils are equal, round, and reactive to light. Right eye exhibits no chemosis, no discharge, no exudate and no hordeolum. No foreign body present in the right eye. Left eye exhibits no chemosis, no discharge, no exudate and no hordeolum. No foreign body present in the left eye. Right conjunctiva is not injected. Right conjunctiva has no hemorrhage. Left conjunctiva is not injected. Left conjunctiva has no hemorrhage. No  scleral icterus. Right eye exhibits normal extraocular motion and no nystagmus. Left eye exhibits normal extraocular motion and no nystagmus. Right pupil is round and reactive. Left pupil is round and reactive. Pupils are equal.    Red outlines generalized areas of erythema   Neck: Normal range of motion. Neck supple. No thyromegaly present.  Cardiovascular: Normal rate, regular rhythm, normal heart sounds and intact distal pulses.  Exam reveals no gallop and no friction rub.   No murmur heard. Pulmonary/Chest: Effort normal and breath sounds normal. No respiratory distress. She has no wheezes. She has no rales. She exhibits no tenderness.  Lymphadenopathy:    She has no cervical adenopathy.    Neurological: She is alert and oriented to person, place, and time. No cranial nerve deficit. She exhibits normal muscle tone. Coordination normal.  Skin: Skin is warm and dry. No rash noted. She is not diaphoretic.  Psychiatric: She has a normal mood and affect. Her behavior is normal. Judgment and thought content normal.      Assessment & Plan:

## 2014-02-14 NOTE — Patient Instructions (Signed)
Keep using the ointment on both eyes. We will follow up in 1 week and also contact you with your lab results.

## 2014-02-14 NOTE — Progress Notes (Signed)
Pre visit review using our clinic review tool, if applicable. No additional management support is needed unless otherwise documented below in the visit note. 

## 2014-02-15 ENCOUNTER — Encounter: Payer: Self-pay | Admitting: Nurse Practitioner

## 2014-02-15 LAB — ANA: Anti Nuclear Antibody(ANA): NEGATIVE

## 2014-02-15 LAB — SEDIMENTATION RATE: SED RATE: 15 mm/h (ref 0–22)

## 2014-11-28 LAB — BASIC METABOLIC PANEL
BUN: 9 mg/dL (ref 4–21)
Creatinine: 0.8 mg/dL (ref 0.5–1.1)
GLUCOSE: 61 mg/dL
Potassium: 4.8 mmol/L (ref 3.4–5.3)
SODIUM: 140 mmol/L (ref 137–147)

## 2014-11-28 LAB — HEPATIC FUNCTION PANEL: ALT: 12 U/L (ref 7–35)

## 2015-02-16 LAB — HM PAP SMEAR: HM Pap smear: NORMAL

## 2015-02-16 LAB — HM MAMMOGRAPHY

## 2015-03-20 ENCOUNTER — Ambulatory Visit: Payer: Commercial Managed Care - PPO | Admitting: Family Medicine

## 2015-03-20 ENCOUNTER — Other Ambulatory Visit: Payer: Self-pay | Admitting: Internal Medicine

## 2015-03-20 ENCOUNTER — Ambulatory Visit
Admission: RE | Admit: 2015-03-20 | Discharge: 2015-03-20 | Disposition: A | Discharge status: Home / Self Care | Payer: 59 | Source: Ambulatory Visit | Attending: Internal Medicine | Admitting: Internal Medicine

## 2015-03-20 ENCOUNTER — Encounter: Payer: Self-pay | Admitting: Internal Medicine

## 2015-03-20 ENCOUNTER — Telehealth: Payer: Self-pay

## 2015-03-20 DIAGNOSIS — M79662 Pain in left lower leg: Secondary | ICD-10-CM

## 2015-03-20 NOTE — Telephone Encounter (Signed)
Reason for call: pt c/o pain in lower left leg, swollen calf Symptoms: swelling, leg cramps, no pain Duration: started this morning Medications: no Last seen for this problem: no Seen by: no one   Pt thinks she may have DVT, Please advise, thanks

## 2015-03-20 NOTE — Telephone Encounter (Signed)
A patient with DVT symptoms should have been addressed with a walk back to the doctor if you could not get her a same day appointment with someone in the office.  I don't understand why you felt that a lady who thinks she may have worms is more critical than a patient with a possible DVT.  Help me understand  You were at lunch by the time I saw this so it will probably have been handled by the time you return .

## 2015-03-27 ENCOUNTER — Encounter: Payer: Self-pay | Admitting: Nurse Practitioner

## 2015-03-27 ENCOUNTER — Ambulatory Visit (INDEPENDENT_AMBULATORY_CARE_PROVIDER_SITE_OTHER): Payer: 59 | Admitting: Nurse Practitioner

## 2015-03-27 VITALS — BP 104/74 | HR 69 | Temp 97.8°F | Wt 139.0 lb

## 2015-03-27 DIAGNOSIS — R5382 Chronic fatigue, unspecified: Secondary | ICD-10-CM

## 2015-03-27 DIAGNOSIS — E663 Overweight: Secondary | ICD-10-CM

## 2015-03-27 DIAGNOSIS — F419 Anxiety disorder, unspecified: Secondary | ICD-10-CM | POA: Diagnosis not present

## 2015-03-27 DIAGNOSIS — Z1211 Encounter for screening for malignant neoplasm of colon: Secondary | ICD-10-CM

## 2015-03-27 DIAGNOSIS — E559 Vitamin D deficiency, unspecified: Secondary | ICD-10-CM

## 2015-03-27 NOTE — Progress Notes (Signed)
Patient ID: Alicia Brady Katt, female    DOB: 09/11/1955  Age: 60 y.o. MRN: [id number]  CC: Follow-up   HPI Alicia Brady Dierks presents for follow up since she has not been seen by her PCP in 1 yr.   1) Work related stressors are stable currently. Denies using anything or seeking counseling.   2) [facility] [place] [facility] and Mammogram, which are self reportedly up to date   3) 25,000 IU twice weekly vitamin D for deficiency  Denies need for refills at this time  4) Pt reports seeing a [facility] or [facility] (unsure of name) at this time for care of her "functional medicine" needs.   They check her thyroid levels she reports and is on Armour thyroid 60 mg daily.   Labs- she will bring a copy to next visit  History Olegario MessierKathy has a past medical history of Vitamin D deficiency; Anxiety; and Obesity (BMI 30-39.9).   She has past surgical history that includes Endometrial biopsy (Jan 2012).   Her family history includes Alcohol abuse in her mother; Diabetes in her father; Heart disease in her father and sister.She reports that she has never smoked. She has never used smokeless tobacco. She reports that she does not drink alcohol or use illicit drugs.  No outpatient prescriptions prior to visit.   No facility-administered medications prior to visit.    ROS Review of Systems  Constitutional: Negative for fever, chills, diaphoresis and fatigue.  Respiratory: Negative for chest tightness, shortness of breath and wheezing.   Cardiovascular: Negative for chest pain, palpitations and leg swelling.  Gastrointestinal: Negative for nausea, vomiting and diarrhea.  Skin: Negative for rash.  Neurological: Negative for dizziness, weakness, numbness and headaches.  Psychiatric/Behavioral: The patient is not nervous/anxious.     Objective:  BP 104/74 mmHg  Pulse 69  Temp(Src) 97.8 F (36.6 C) (Oral)  Wt 139 lb (63.05 kg)  SpO2 98%  Physical Exam  Constitutional: She is  oriented to person, place, and time. She appears well-developed and well-nourished. No distress.  HENT:  Head: Normocephalic and atraumatic.  Right Ear: External ear normal.  Left Ear: External ear normal.  Cardiovascular: Normal rate, regular rhythm and normal heart sounds.  Exam reveals no gallop and no friction rub.   No murmur heard. Pulmonary/Chest: Effort normal and breath sounds normal. No respiratory distress. She has no wheezes. She has no rales. She exhibits no tenderness.  Neurological: She is alert and oriented to person, place, and time. Coordination normal.  Skin: Skin is warm and dry. No rash noted. She is not diaphoretic.  Psychiatric: She has a normal mood and affect. Her behavior is normal. Judgment and thought content normal.      Assessment & Plan:   There are no diagnoses linked to this encounter. I am having Ms. Anwar maintain her ARMOUR THYROID, Cholecalciferol (VITAMIN D3 PO), ULTRAFLORA IMMUNE HEALTH, Barberry-Oreg Grape-Goldenseal (BERBERINE COMPLEX PO), OVER THE COUNTER MEDICATION, and Docosahexaenoic Acid (DHA PO).  Meds ordered this encounter  Medications  . ARMOUR THYROID 60 MG tablet    Sig: Take 1 tablet by mouth daily.    Refill:  3  . Cholecalciferol (VITAMIN D3 PO)    Sig: Take 1 capsule by mouth 2 (two) times a week. 25,000iu  . Probiotic Product (ULTRAFLORA IMMUNE HEALTH) 170 MG CAPS    Sig: Take 1 capsule by mouth daily.  Claris Gower. Barberry-Oreg Grape-Goldenseal (BERBERINE COMPLEX PO)    Sig: Take 1 capsule by  mouth daily.  Marland Kitchen OVER THE COUNTER MEDICATION    Sig: Take 1 capsule by mouth daily. Cumin  . Docosahexaenoic Acid (DHA PO)    Sig: Take 1 capsule by mouth daily.     Follow-up: No Follow-up on file.

## 2015-03-27 NOTE — Progress Notes (Signed)
Pre visit review using our clinic review tool, if applicable. No additional management support is needed unless otherwise documented below in the visit note. 

## 2015-04-04 NOTE — Assessment & Plan Note (Addendum)
Currently on supplementation Labs believed to have been drawn at the [facility] [place]  Pt reports she will send us a copy

## 2015-04-04 NOTE — Assessment & Plan Note (Signed)
Stable currently at this time without medication Work is main stressor  FU with Dr. Darrick Huntsmanullo

## 2015-04-04 NOTE — Assessment & Plan Note (Signed)
Wt Readings from Last 3 Encounters:  03/27/15 139 lb (63.05 kg)  02/14/14 134 lb 12.8 oz (61.145 kg)  05/25/13 129 lb 12 oz (58.854 kg)   Increasing continually. Advised healthy diet and exercise will be most helpful for stress and weight.

## 2015-04-04 NOTE — Assessment & Plan Note (Signed)
Stable currently. On many vitamins and supplements. FU prn worsening/failure to improve.

## 2015-04-04 NOTE — Assessment & Plan Note (Addendum)
Last Colon screening via [facility] in 2013 Negative findings under labs  Discussed Cologuard- will let us know if she wants

## 2015-06-11 ENCOUNTER — Telehealth: Payer: Self-pay

## 2015-06-11 MED ORDER — HYDROCORTISONE ACETATE 25 MG RE SUPP: 25.0000 mg | Freq: Two times a day (BID) | RECTAL | Status: DC

## 2015-06-11 NOTE — Telephone Encounter (Signed)
Suppository sent.  FYI,  No need to do occult blood testing if a patient is seeing  [facility].

## 2015-06-11 NOTE — Telephone Encounter (Signed)
I spoke with the patient. Explained in detail that we could have her seen by another provider, she did refuses as the appt available are for the female doctors.  I reviewed your note with her and she excepted an appointment on Monday evening with you and would like the suppository for now.  I explained that if her symptoms get worse or she has pain with them that she needs to been seen in a ER/urgent care setting that can do labs and an exam more thoroughly and possibly give fluid or blood as needed.  She asked about a colonoscopy vs a cologuard and I explained that those were reviewed with her at her last appt with Naomie Deanarrie Doss and that if they were note done it might be a good time to discuss those again with you at her visit.  She asked about a occult blood stool test and I explained that it will show if there was blood or not and that you may order that on Monday also.  She was in agreement with our plan to have her be seen on Monday or if symptoms continued or got worse to seek medical attention sooner.  thanks

## 2015-06-11 NOTE — Telephone Encounter (Signed)
I have not seen this patient since 2015  And Carrie hasn't since 2016.  I have no appts today or for the rest of the week.   If she is offered an appt with one of the other docs and refuses,  Then the best I can do is tell her to use a suppository which I can prescribe for hemorrhoids and give her an appt next Monday .  If the bleeding gets worse she will need to go to [facility] or ER

## 2015-06-11 NOTE — Addendum Note (Signed)
Addended by: Sherlene ShamsULLO, Zoe Goonan L on: 06/11/2015 09:51 AM   Modules accepted: Orders

## 2015-06-11 NOTE — Telephone Encounter (Signed)
See unrouted message   ,  Lanelle Balkthy please  handle

## 2015-06-11 NOTE — Telephone Encounter (Signed)
Patient has walked into the office, she has been having Loose BM's for approximately a week, this morning had bright red blood   She is not a normal have a BM everyday person, but has been having them daily, they have been loose normal color for the past few days.  She noticed this morning she had gas and urinated and then when she wiped it was bright red blood.  She had hemorrhoids back approximately 30 years ago with giving birth, but no issues since.  After showering wiped again and there was blood yet again.  No abdominal pain, no tenderness noted.  She states her urinating has been fine, no issues or burning or pain, no blood from that region.  No fevers.  She has a husband in a wheelchair that she cares for and has been doing yard work this past week, not other symptoms or changes noted. Please advise?

## 2015-06-11 NOTE — Telephone Encounter (Signed)
Looked to see if there was a past order for suppository none in chart.

## 2015-06-16 ENCOUNTER — Encounter: Payer: Self-pay | Admitting: Internal Medicine

## 2015-06-16 ENCOUNTER — Ambulatory Visit (INDEPENDENT_AMBULATORY_CARE_PROVIDER_SITE_OTHER): Payer: 59 | Admitting: Internal Medicine

## 2015-06-16 VITALS — BP 112/60 | HR 64 | Temp 97.8°F | Ht 60.0 in | Wt 143.1 lb

## 2015-06-16 DIAGNOSIS — E559 Vitamin D deficiency, unspecified: Secondary | ICD-10-CM | POA: Diagnosis not present

## 2015-06-16 DIAGNOSIS — E034 Atrophy of thyroid (acquired): Secondary | ICD-10-CM

## 2015-06-16 DIAGNOSIS — K625 Hemorrhage of anus and rectum: Secondary | ICD-10-CM | POA: Diagnosis not present

## 2015-06-16 DIAGNOSIS — E038 Other specified hypothyroidism: Secondary | ICD-10-CM | POA: Diagnosis not present

## 2015-06-16 NOTE — Progress Notes (Signed)
Pre visit review using our clinic review tool, if applicable. No additional management support is needed unless otherwise documented below in the visit note. 

## 2015-06-16 NOTE — Patient Instructions (Signed)
Your recent episode of  rectal bleeding may have been from an internal hemorrhoid.  Keeping your stools moving is the best way to prevent this from getting irritated again   It is time , however, to have colon CA screening,  With either Cologuard or a colonoscopy.  About Hemorrhoids  Hemorrhoids are swollen veins in the lower rectum and anus.  Also called piles, hemorrhoids are a common problem.  Hemorrhoids may be internal (inside the rectum) or external (around the anus).  Internal Hemorrhoids  Internal hemorrhoids are often painless, but they rarely cause bleeding.  The internal veins may stretch and fall down (prolapse) through the anus to the outside of the body.  The veins may then become irritated and painful.  External Hemorrhoids  External hemorrhoids can be easily seen or felt around the anal opening.  They are under the skin around the anus.  When the swollen veins are scratched or broken by straining, rubbing or wiping they sometimes bleed.  How Hemorrhoids Occur  Veins in the rectum and around the anus tend to swell under pressure.  Hemorrhoids can result from increased pressure in the veins of your anus or rectum.  Some sources of pressure are:   Straining to have a bowel movement because of constipation  Waiting too long to have a bowel movement  Coughing and sneezing often  Sitting for extended periods of time, including on the toilet  Diarrhea  Obesity  Trauma or injury to the anus  Some liver diseases  Stress  Family history of hemorrhoids  Pregnancy  Pregnant women should try to avoid becoming constipated, because they are more likely to have hemorrhoids during pregnancy.  In the last trimester of pregnancy, the enlarged uterus may press on blood vessels and causes hemorrhoids.  In addition, the strain of childbirth sometimes causes hemorrhoids after the birth.  Symptoms of Hemorrhoids  Some symptoms of hemorrhoids include:  Swelling and/or a  tender lump around the anus  Itching, mild burning and bleeding around the anus  Painful bowel movements with or without constipation  Bright red blood covering the stool, on toilet paper or in the toilet bowel.   Symptoms usually go away within a few days.  Always talk to your doctor about any bleeding to make sure it is not from some other causes.  Diagnosing and Treating Hemorrhoids  Diagnosis is made by an examination by your healthcare provider.  Special test can be performed by your doctor.    Most cases of hemorrhoids can be treated with:  High-fiber diet: Eat more high-fiber foods, which help prevent constipation.  Ask for more detailed fiber information on types and sources of fiber from your healthcare provider.  Fluids: Drink plenty of water.  This helps soften bowel movements so they are easier to pass.  Sitz baths and cold packs: Sitting in lukewarm water two or three times a day for 15 minutes cleases the anal area and may relieve discomfort.  If the water is too hot, swelling around the anus will get worse.  Placing a cloth-covered ice pack on the anus for ten minutes four times a day can also help reduce selling.  Gently pushing a prolapsed hemorrhoid back inside after the bath or ice pack can be helpful.  Medications: For mild discomfort, your healthcare provider may suggest over-the-counter pain medication or prescribe a cream or ointment for topical use.  The cream may contain witch hazel, zinc oxide or petroleum jelly.  Medicated suppositories are also a  treatment option.  Always consult your doctor before applying medications or creams.  Procedures and surgeries: There are also a number of procedures and surgeries to shrink or remove hemorrhoids in more serious cases.  Talk to your physician about these options.  You can often prevent hemorrhoids or keep them from becoming worse by maintaining a healthy lifestyle.  Eat a fiber-rich diet of fruits, vegetables and whole  grains.  Also, drink plenty of water and exercise regularly.   2007, [employer] Doc.30

## 2015-06-16 NOTE — Progress Notes (Signed)
Subjective:  Patient ID: Alicia Brady, female    DOB: [birth year]  Age: 60 y.o. MRN: [id number]  CC: The primary encounter diagnosis was Hypothyroidism due to acquired atrophy of thyroid. Diagnoses of Rectal bleeding and Vitamin D deficiency were also pertinent to this visit.  HPI Alicia Brady presents for RECENT SIGHTING OF BLood in stool, occurred on May 31.  now resolved,  And for general follow up. Last seen over 1.5 years ago    During recent attempt to have a BM  She passed only gas, then passed BRBPR .  Had recently exerted herself pushing 250 lb husband up an incline in a wheelchair.  Has been using a daily suppository.   Uses [place],  [facility] [employer] [employer].  Stool softener wellness  No prior colonoscopy no GH of  colon CA ,  Mother had polyps. gets PAP and mammogram by gyn  Husband has been an incomplete  paraplegic for the past 11 yeats, due to AVM in spinal cord,  Going for a spinal cord stimulator . has a new grandson   Has been on topical HRT (compounded by [facility]) and [facility]  [facility] Dr Elwyn ReachSkeen  [facility]  [facility] Gynecologist  [place]  Had an episode of severe hair loss which was attributed to stress by prior MD    Taking Vit D megadose 50,000 weekly   Walking for exercise 1 mile daily averages 4 days per week           Outpatient Prescriptions Prior to Visit  Medication Sig Dispense Refill  . ARMOUR THYROID 60 MG tablet Take 1 tablet by mouth daily.  3  . Barberry-Oreg Grape-Goldenseal (BERBERINE COMPLEX PO) Take 1 capsule by mouth daily.    . Cholecalciferol (VITAMIN D3 PO) Take 1 capsule by mouth 2 (two) times a week. 25,000iu    . Docosahexaenoic Acid (DHA PO) Take 1 capsule by mouth daily.    . hydrocortisone (ANUSOL-HC) 25 MG suppository Place 1 suppository (25 mg total) rectally 2 (two) times daily. 12 suppository 0  . OVER THE COUNTER MEDICATION Take 1 capsule by mouth daily. Cumin    .  Probiotic Product (ULTRAFLORA IMMUNE HEALTH) 170 MG CAPS Take 1 capsule by mouth daily.     No facility-administered medications prior to visit.    Review of Systems;  Patient denies headache, fevers, malaise, unintentional weight loss, skin rash, eye pain, sinus congestion and sinus pain, sore throat, dysphagia,  hemoptysis , cough, dyspnea, wheezing, chest pain, palpitations, orthopnea, edema, abdominal pain, nausea, melena, diarrhea, constipation, flank pain, dysuria, hematuria, urinary  Frequency, nocturia, numbness, tingling, seizures,  Focal weakness, Loss of consciousness,  Tremor, insomnia, depression, anxiety, and suicidal ideation.      Objective:  BP 112/60 mmHg  Pulse 64  Temp(Src) 97.8 F (36.6 C) (Oral)  Ht 5' (1.524 m)  Wt 143 lb 2 oz (64.921 kg)  BMI 27.95 kg/m2  SpO2 97%  BP Readings from Last 3 Encounters:  06/16/15 112/60  03/27/15 104/74  02/14/14 120/76    Wt Readings from Last 3 Encounters:  06/16/15 143 lb 2 oz (64.921 kg)  03/27/15 139 lb (63.05 kg)  02/14/14 134 lb 12.8 oz (61.145 kg)    General appearance: alert, cooperative and appears stated age Ears: normal TM's and external ear canals both ears Throat: lips, mucosa, and tongue normal; teeth and gums normal Neck: no adenopathy, no carotid bruit, supple, symmetrical, trachea midline and  thyroid not enlarged, symmetric, no tenderness/mass/nodules Back: symmetric, no curvature. ROM normal. No CVA tenderness. Lungs: clear to auscultation bilaterally Heart: regular rate and rhythm, S1, S2 normal, no murmur, click, rub or gallop Abdomen: soft, non-tender; bowel sounds normal; no masses,  no organomegaly Pulses: 2+ and symmetric Skin: Skin color, texture, turgor normal. No rashes or lesions Lymph nodes: Cervical, supraclavicular, and axillary nodes normal.  No results found for: HGBA1C  Lab Results  Component Value Date   CREATININE 0.8 06/11/2011   CREATININE 0.9 10/07/2010    Lab Results    Component Value Date   WBC 5.2 01/31/2012   HGB 12.6 01/31/2012   HCT 37.5 01/31/2012   PLT 312.0 01/31/2012   GLUCOSE 88 06/11/2011   CHOL 192 10/07/2010   TRIG 76.0 10/07/2010   HDL 59.40 10/07/2010   LDLCALC 117* 10/07/2010   ALT 9 06/11/2011   AST 21 06/11/2011   NA 140 06/11/2011   K 5.0 06/11/2011   CL 106 06/11/2011   CREATININE 0.8 06/11/2011   BUN 11 06/11/2011   CO2 27 06/11/2011   TSH 1.81 06/11/2011    US Venous Img Lower Unilateral Left  03/20/2015  CLINICAL DATA:  60 year old female history of calf pain EXAM: LEFT LOWER EXTREMITY VENOUS DOPPLER ULTRASOUND TECHNIQUE: Gray-scale sonography with graded compression, as well as color Doppler and duplex ultrasound were performed to evaluate the lower extremity deep venous systems from the level of the common femoral vein and including the common femoral, femoral, profunda femoral, popliteal and calf veins including the posterior tibial, peroneal and gastrocnemius veins when visible. The superficial great saphenous vein was also interrogated. Spectral Doppler was utilized to evaluate flow at rest and with distal augmentation maneuvers in the common femoral, femoral and popliteal veins. COMPARISON:  None. FINDINGS: Contralateral Common Femoral Vein: Respiratory phasicity is normal and symmetric with the symptomatic side. No evidence of thrombus. Normal compressibility. Common Femoral Vein: No evidence of thrombus. Normal compressibility, respiratory phasicity and response to augmentation. Saphenofemoral Junction: No evidence of thrombus. Normal compressibility and flow on color Doppler imaging. Profunda Femoral Vein: No evidence of thrombus. Normal compressibility and flow on color Doppler imaging. Femoral Vein: No evidence of thrombus. Normal compressibility, respiratory phasicity and response to augmentation. Popliteal Vein: No evidence of thrombus. Normal compressibility, respiratory phasicity and response to augmentation. Calf Veins:  No evidence of thrombus. Normal compressibility and flow on color Doppler imaging. Superficial Great Saphenous Vein: No evidence of thrombus. Normal compressibility and flow on color Doppler imaging. Other Findings:  None. IMPRESSION: Sonographic survey of the left lower extremity negative for DVT. Signed, Yvone Neu. Loreta Ave, DO [facility] Radiology Electronically Signed   By: Gilmer Mor D.O.   On: 03/20/2015 16:56    Assessment & Plan:   Problem List Items Addressed This Visit    Vitamin D deficiency    Managed with weekly megadoses      Hypothyroidism - Primary    Managed with armour thyroid,  Recent TSH was normal  No changes today      Rectal bleeding    resovled with use of steroid suppositories,  Suggesting internal hemorrhoids.  Cologuard recommended.          I am having Alicia Brady maintain her ARMOUR THYROID, Cholecalciferol (VITAMIN D3 PO), ULTRAFLORA IMMUNE HEALTH, Barberry-Oreg Grape-Goldenseal (BERBERINE COMPLEX PO), OVER THE COUNTER MEDICATION, Docosahexaenoic Acid (DHA PO), and hydrocortisone.  No orders of the defined types were placed in this encounter.  There are no discontinued medications.  Follow-up: No Follow-up on file.   Crecencio Mc, MD

## 2015-06-17 DIAGNOSIS — E039 Hypothyroidism, unspecified: Secondary | ICD-10-CM | POA: Insufficient documentation

## 2015-06-17 DIAGNOSIS — K625 Hemorrhage of anus and rectum: Secondary | ICD-10-CM | POA: Insufficient documentation

## 2015-06-17 HISTORY — DX: Hypothyroidism, unspecified: E03.9

## 2015-06-17 NOTE — Assessment & Plan Note (Signed)
Managed with weekly megadoses

## 2015-06-17 NOTE — Assessment & Plan Note (Signed)
Managed with armour thyroid,  Recent TSH was normal  No changes today

## 2015-06-17 NOTE — Assessment & Plan Note (Signed)
resovled with use of steroid suppositories,  Suggesting internal hemorrhoids.  Cologuard recommended.

## 2015-07-03 ENCOUNTER — Encounter: Payer: Self-pay | Admitting: Internal Medicine

## 2015-07-10 ENCOUNTER — Encounter: Payer: Self-pay | Admitting: Internal Medicine

## 2016-07-30 ENCOUNTER — Other Ambulatory Visit: Payer: Self-pay

## 2016-07-30 ENCOUNTER — Telehealth: Payer: Self-pay | Admitting: Gastroenterology

## 2016-07-30 DIAGNOSIS — Z1211 Encounter for screening for malignant neoplasm of colon: Secondary | ICD-10-CM

## 2016-07-30 NOTE — Telephone Encounter (Signed)
Patient called and is ready to schedule a screening colonocopy. She would like to schedule with Dr. Servando SnareWohl. She is in process of getting a new primary care doctor.

## 2016-07-30 NOTE — Telephone Encounter (Signed)
Gastroenterology Pre-Procedure Review  Request Date: 08/16/16 Requesting Physician: Dr. Servando SnareWohl  PATIENT REVIEW QUESTIONS: The patient responded to the following health history questions as indicated:    1. Are you having any GI issues? no 2. Do you have a personal history of Polyps? no 3. Do you have a family history of Colon Cancer or Polyps? no 4. Diabetes Mellitus? no 5. Joint replacements in the past 12 months?no 6. Major health problems in the past 3 months?no 7. Any artificial heart valves, MVP, or defibrillator?no    MEDICATIONS & ALLERGIES:    Patient reports the following regarding taking any anticoagulation/antiplatelet therapy:   Plavix, Coumadin, Eliquis, Xarelto, Lovenox, Pradaxa, Brilinta, or Effient? no Aspirin? no  Patient confirms/reports the following medications:  Current Outpatient Prescriptions  Medication Sig Dispense Refill  . ARMOUR THYROID 60 MG tablet Take 1 tablet by mouth daily.  3  . Barberry-Oreg Grape-Goldenseal (BERBERINE COMPLEX PO) Take 1 capsule by mouth daily.    . Cholecalciferol (VITAMIN D3 PO) Take 1 capsule by mouth 2 (two) times a week. 25,000iu    . Docosahexaenoic Acid (DHA PO) Take 1 capsule by mouth daily.    . hydrocortisone (ANUSOL-HC) 25 MG suppository Place 1 suppository (25 mg total) rectally 2 (two) times daily. 12 suppository 0  . OVER THE COUNTER MEDICATION Take 1 capsule by mouth daily. Cumin    . Probiotic Product (ULTRAFLORA IMMUNE HEALTH) 170 MG CAPS Take 1 capsule by mouth daily.    Marland Kitchen. tobramycin-dexamethasone (TOBRADEX) ophthalmic ointment      No current facility-administered medications for this visit.     Patient confirms/reports the following allergies:  Allergies  Allergen Reactions  . Levaquin [Levofloxacin] Nausea Only    Dizziness    No orders of the defined types were placed in this encounter.   AUTHORIZATION INFORMATION Primary Insurance: 1D#: Group #:  Secondary Insurance: 1D#: Group #:  SCHEDULE  INFORMATION: Date: 08/16/16 Time: Location:[facility]

## 2016-08-02 ENCOUNTER — Telehealth: Payer: Self-pay | Admitting: Gastroenterology

## 2016-08-02 NOTE — Telephone Encounter (Signed)
08/02/16 [facility]  [phone] / Z12.11 Auth #: [id number] Servando Snare(Wohl, [facility], 08/16/16)

## 2016-08-09 ENCOUNTER — Encounter: Payer: Self-pay | Admitting: *Deleted

## 2016-08-11 NOTE — Discharge Instructions (Signed)
General Anesthesia, Adult, Care After °These instructions provide you with information about caring for yourself after your procedure. Your health care provider may also give you more specific instructions. Your treatment has been planned according to current medical practices, but problems sometimes occur. Call your health care provider if you have any problems or questions after your procedure. °What can I expect after the procedure? °After the procedure, it is common to have: °· Vomiting. °· A sore throat. °· Mental slowness. ° °It is common to feel: °· Nauseous. °· Cold or shivery. °· Sleepy. °· Tired. °· Sore or achy, even in parts of your body where you did not have surgery. ° °Follow these instructions at home: °For at least 24 hours after the procedure: °· Do not: °? Participate in activities where you could fall or become injured. °? Drive. °? Use heavy machinery. °? Drink alcohol. °? Take sleeping pills or medicines that cause drowsiness. °? Make important decisions or sign legal documents. °? Take care of children on your own. °· Rest. °Eating and drinking °· If you vomit, drink water, juice, or soup when you can drink without vomiting. °· Drink enough fluid to keep your urine clear or pale yellow. °· Make sure you have little or no nausea before eating solid foods. °· Follow the diet recommended by your health care provider. °General instructions °· Have a responsible adult stay with you until you are awake and alert. °· Return to your normal activities as told by your health care provider. Ask your health care provider what activities are safe for you. °· Take over-the-counter and prescription medicines only as told by your health care provider. °· If you smoke, do not smoke without supervision. °· Keep all follow-up visits as told by your health care provider. This is important. °Contact a health care provider if: °· You continue to have nausea or vomiting at home, and medicines are not helpful. °· You  cannot drink fluids or start eating again. °· You cannot urinate after 8-12 hours. °· You develop a skin rash. °· You have fever. °· You have increasing redness at the site of your procedure. °Get help right away if: °· You have difficulty breathing. °· You have chest pain. °· You have unexpected bleeding. °· You feel that you are having a life-threatening or urgent problem. °This information is not intended to replace advice given to you by your health care provider. Make sure you discuss any questions you have with your health care provider. °Document Released: 04/05/2000 Document Revised: 06/02/2015 Document Reviewed: 12/12/2014 °Elsevier Interactive Patient Education © 2018 Elsevier Inc. ° °

## 2016-08-16 ENCOUNTER — Encounter
Admission: RE | Disposition: A | Discharge status: Home / Self Care | Payer: Self-pay | Source: Ambulatory Visit | Attending: Gastroenterology

## 2016-08-16 ENCOUNTER — Ambulatory Visit
Admission: RE | Admit: 2016-08-16 | Discharge: 2016-08-16 | Disposition: A | Discharge status: Home / Self Care | Payer: Commercial Managed Care - HMO | Source: Ambulatory Visit | Attending: Gastroenterology | Admitting: Gastroenterology

## 2016-08-16 ENCOUNTER — Ambulatory Visit: Payer: Commercial Managed Care - HMO | Admitting: Anesthesiology

## 2016-08-16 DIAGNOSIS — Z8249 Family history of ischemic heart disease and other diseases of the circulatory system: Secondary | ICD-10-CM | POA: Insufficient documentation

## 2016-08-16 DIAGNOSIS — Z881 Allergy status to other antibiotic agents status: Secondary | ICD-10-CM | POA: Diagnosis not present

## 2016-08-16 DIAGNOSIS — E559 Vitamin D deficiency, unspecified: Secondary | ICD-10-CM | POA: Diagnosis not present

## 2016-08-16 DIAGNOSIS — K5730 Diverticulosis of large intestine without perforation or abscess without bleeding: Secondary | ICD-10-CM | POA: Insufficient documentation

## 2016-08-16 DIAGNOSIS — E039 Hypothyroidism, unspecified: Secondary | ICD-10-CM | POA: Insufficient documentation

## 2016-08-16 DIAGNOSIS — F419 Anxiety disorder, unspecified: Secondary | ICD-10-CM | POA: Diagnosis not present

## 2016-08-16 DIAGNOSIS — Z1211 Encounter for screening for malignant neoplasm of colon: Secondary | ICD-10-CM | POA: Diagnosis not present

## 2016-08-16 HISTORY — DX: Hypothyroidism, unspecified: E03.9

## 2016-08-16 HISTORY — PX: COLONOSCOPY WITH PROPOFOL: SHX5780

## 2016-08-16 HISTORY — DX: Vitamin D deficiency, unspecified: E55.9

## 2016-08-16 SURGERY — COLONOSCOPY WITH PROPOFOL
Anesthesia: General | Wound class: Contaminated

## 2016-08-16 MED ORDER — LACTATED RINGERS IV SOLN
10.0000 mL/h | INTRAVENOUS | Status: DC
  Administered 2016-08-16: 10 mL/h via INTRAVENOUS

## 2016-08-16 MED ORDER — PROPOFOL 10 MG/ML IV BOLUS
INTRAVENOUS | Status: DC | PRN
  Administered 2016-08-16: 20 mg via INTRAVENOUS
  Administered 2016-08-16: 100 mg via INTRAVENOUS
  Administered 2016-08-16: 50 mg via INTRAVENOUS

## 2016-08-16 MED ORDER — STERILE WATER FOR IRRIGATION IR SOLN
Status: DC | PRN
  Administered 2016-08-16: 10:00:00

## 2016-08-16 MED ORDER — LIDOCAINE HCL (CARDIAC) 20 MG/ML IV SOLN
INTRAVENOUS | Status: DC | PRN
  Administered 2016-08-16: 30 mg via INTRAVENOUS

## 2016-08-16 SURGICAL SUPPLY — 23 items
CANISTER SUCT 1200ML W/VALVE (MISCELLANEOUS) ×3 IMPLANT
CLIP HMST 235XBRD CATH ROT (MISCELLANEOUS) IMPLANT
CLIP RESOLUTION 360 11X235 (MISCELLANEOUS)
FCP ESCP3.2XJMB 240X2.8X (MISCELLANEOUS)
FORCEPS BIOP RAD 4 LRG CAP 4 (CUTTING FORCEPS) IMPLANT
FORCEPS BIOP RJ4 240 W/NDL (MISCELLANEOUS)
FORCEPS ESCP3.2XJMB 240X2.8X (MISCELLANEOUS) IMPLANT
GOWN CVR UNV OPN BCK APRN NK (MISCELLANEOUS) ×2 IMPLANT
GOWN ISOL THUMB LOOP REG UNIV (MISCELLANEOUS) ×6
INJECTOR VARIJECT VIN23 (MISCELLANEOUS) IMPLANT
KIT DEFENDO VALVE AND CONN (KITS) IMPLANT
KIT ENDO PROCEDURE OLY (KITS) ×3 IMPLANT
MARKER SPOT ENDO TATTOO 5ML (MISCELLANEOUS) IMPLANT
PAD GROUND ADULT SPLIT (MISCELLANEOUS) IMPLANT
PROBE APC STR FIRE (PROBE) IMPLANT
RETRIEVER NET ROTH 2.5X230 LF (MISCELLANEOUS) IMPLANT
SNARE SHORT THROW 13M SML OVAL (MISCELLANEOUS) IMPLANT
SNARE SHORT THROW 30M LRG OVAL (MISCELLANEOUS) IMPLANT
SNARE SNG USE RND 15MM (INSTRUMENTS) IMPLANT
SPOT EX ENDOSCOPIC TATTOO (MISCELLANEOUS)
TRAP ETRAP POLY (MISCELLANEOUS) IMPLANT
VARIJECT INJECTOR VIN23 (MISCELLANEOUS)
WATER STERILE IRR 250ML POUR (IV SOLUTION) ×3 IMPLANT

## 2016-08-16 NOTE — Op Note (Signed)
[facility] Gastroenterology Patient Name: Alicia EvesKathy Wenke Procedure Date: 08/16/2016 9:50 AM MRN: [id number] Account #: [id number] Date of Birth: [birth year] Admit Type: Outpatient Age: 61 [facility] Gender: Female Note Status: Finalized Procedure:            Colonoscopy Indications:          Screening for colorectal malignant neoplasm Providers:            Midge Miniumarren Ervin Hensley MD, MD Referring MD:         Duncan Dulleresa Tullo, MD (Referring MD) Medicines:            Propofol per Anesthesia Complications:        No immediate complications. Procedure:            Pre-Anesthesia Assessment:                       - Prior to the procedure, a History and Physical was                        performed, and patient medications and allergies were                        reviewed. The patient's tolerance of previous                        anesthesia was also reviewed. The risks and benefits of                        the procedure and the sedation options and risks were                        discussed with the patient. All questions were                        answered, and informed consent was obtained. Prior                        Anticoagulants: The patient has taken no previous                        anticoagulant or antiplatelet agents. ASA Grade                        Assessment: II - A patient with mild systemic disease.                        After reviewing the risks and benefits, the patient was                        deemed in satisfactory condition to undergo the                        procedure.                       After obtaining informed consent, the colonoscope was                        passed under direct vision. Throughout the procedure,  the patient's blood pressure, pulse, and oxygen                        saturations were monitored continuously. The Olympus                        Colonoscope [id number]) was introduced through the                      anus and advanced to the the cecum, identified by                        appendiceal orifice and ileocecal valve. The                        colonoscopy was performed without difficulty. The                        patient tolerated the procedure well. The quality of                        the bowel preparation was excellent. Findings:      The perianal and digital rectal examinations were normal.      A few small-mouthed diverticula were found in the sigmoid colon. Impression:           - Diverticulosis in the sigmoid colon.                       - No specimens collected. Recommendation:       - Discharge patient to home.                       - Resume previous diet.                       - Continue present medications. Procedure Code(s):    --- Professional ---                       [phone], Colonoscopy, flexible; diagnostic, including                        collection of specimen(s) by brushing or washing, when                        performed (separate procedure) Diagnosis Code(s):    --- Professional ---                       Z12.11, Encounter for screening for malignant neoplasm                        of colon CPT copyright 2016 American Medical Association. All rights reserved. The codes documented in this report are preliminary and upon coder review may  be revised to meet current compliance requirements. Midge Minium MD, MD 08/16/2016 10:08:14 AM This report has been signed electronically. Number of Addenda: 0 Note Initiated On: 08/16/2016 9:50 AM Scope Withdrawal Time: 0 hours 6 minutes 34 seconds  Total Procedure Duration: 0 hours 8 minutes 43 seconds       [facility]

## 2016-08-16 NOTE — H&P (Signed)
   Alicia Miniumarren Soma Lizak, MD [facility] [address] [place], [place] [address] Phone: [phone] Fax : [phone]  Primary Care Physician:  Sherlene Shamsullo, Teresa L, MD Primary Gastroenterologist:  Dr. Servando SnareWohl  Pre-Procedure History & Physical: HPI:  Alicia Brady is a 61 y.o. female is here for a screening colonoscopy.   Past Medical History:  Diagnosis Date  . Anxiety    managed with alprazolam  . Hypothyroidism 06/17/2015  . Vitamin D deficiency 06/11/2011    Past Surgical History:  Procedure Laterality Date  . ENDOMETRIAL BIOPSY  01/2010   for spotting, biopsy normal (done in MD office)  . WISDOM TOOTH EXTRACTION      Prior to Admission medications   Medication Sig Start Date End Date Taking? Authorizing Provider  Cholecalciferol (VITAMIN D3 PO) Take 1 capsule by mouth 2 (two) times a week. 25,000iu   Yes [provider]  ARMOUR THYROID 60 MG tablet Take 1 tablet by mouth daily. 02/06/15   [provider]    Allergies as of 07/30/2016 - Review Complete 06/16/2015  Allergen Reaction Noted  . Levaquin [levofloxacin] Nausea Only 05/25/2013    Family History  Problem Relation Age of Onset  . Alcohol abuse Mother   . Diabetes Father   . Heart disease Father   . Heart disease Sister     Social History   Social History  . Marital status: Married    Spouse name: N/A  . Number of children: N/A  . Years of education: N/A   Occupational History  . Not on file.   Social History Main Topics  . Smoking status: Never Smoker  . Smokeless tobacco: Never Used  . Alcohol use No  . Drug use: No  . Sexual activity: No   Other Topics Concern  . Not on file   Social History Narrative  . No narrative on file    Review of Systems: See HPI, otherwise negative ROS  Physical Exam: BP 117/65   Pulse 74   Temp 97.8 F (36.6 C)   Ht 5' (1.524 m)   Wt 137 lb (62.1 kg)   SpO2 100%   BMI 26.76 kg/m  General:   Alert,  pleasant and cooperative in NAD Head:   Normocephalic and atraumatic. Neck:  Supple; no masses or thyromegaly. Lungs:  Clear throughout to auscultation.    Heart:  Regular rate and rhythm. Abdomen:  Soft, nontender and nondistended. Normal bowel sounds, without guarding, and without rebound.   Neurologic:  Alert and  oriented x4;  grossly normal neurologically.  Impression/Plan: Alicia Brady Chura is now here to undergo a screening colonoscopy.  Risks, benefits, and alternatives regarding colonoscopy have been reviewed with the patient.  Questions have been answered.  All parties agreeable.

## 2016-08-16 NOTE — Anesthesia Preprocedure Evaluation (Signed)
Anesthesia Evaluation  Patient identified by MRN, date of birth, ID band Patient awake    Reviewed: Allergy & Precautions, NPO status   Airway Mallampati: I  TM Distance: <3 FB     Dental no notable dental hx.    Pulmonary           Cardiovascular negative cardio ROS   Rhythm:Regular Rate:Normal     Neuro/Psych Anxiety    GI/Hepatic negative GI ROS,   Endo/Other  Hypothyroidism   Renal/GU      Musculoskeletal   Abdominal   Peds  Hematology   Anesthesia Other Findings   Reproductive/Obstetrics                            Anesthesia Physical Anesthesia Plan  ASA: II  Anesthesia Plan: General   Post-op Pain Management:    Induction:   PONV Risk Score and Plan:   Airway Management Planned: Natural Airway  Additional Equipment:   Intra-op Plan:   Post-operative Plan:   Informed Consent: I have reviewed the patients History and Physical, chart, labs and discussed the procedure including the risks, benefits and alternatives for the proposed anesthesia with the patient or authorized representative who has indicated his/her understanding and acceptance.     Plan Discussed with:   Anesthesia Plan Comments:         Anesthesia Quick Evaluation

## 2016-08-16 NOTE — Transfer of Care (Signed)
Immediate Anesthesia Transfer of Care Note  Patient: Amado NashKathy B Mcfadyen  Procedure(s) Performed: Procedure(s): COLONOSCOPY WITH PROPOFOL (N/A)  Patient Location: PACU  Anesthesia Type: General  Level of Consciousness: awake, alert  and patient cooperative  Airway and Oxygen Therapy: Patient Spontanous Breathing and Patient connected to supplemental oxygen  Post-op Assessment: Post-op Vital signs reviewed, Patient's Cardiovascular Status Stable, Respiratory Function Stable, Patent Airway and No signs of Nausea or vomiting  Post-op Vital Signs: Reviewed and stable  Complications: No apparent anesthesia complications

## 2016-08-16 NOTE — Anesthesia Postprocedure Evaluation (Signed)
Anesthesia Post Note  Patient: Alicia Brady  Procedure(s) Performed: Procedure(s) (LRB): COLONOSCOPY WITH PROPOFOL (N/A)  Patient location during evaluation: PACU Anesthesia Type: General Level of consciousness: awake Pain management: pain level controlled Vital Signs Assessment: post-procedure vital signs reviewed and stable Respiratory status: spontaneous breathing, nonlabored ventilation and respiratory function stable Cardiovascular status: stable Postop Assessment: no signs of nausea or vomiting Anesthetic complications: no    Alicia Brady

## 2016-08-16 NOTE — Anesthesia Procedure Notes (Signed)
Date/Time: 08/16/2016 9:56 AM Performed by: Maree KrabbeWARREN, Marylou Wages Pre-anesthesia Checklist: Patient identified, Emergency Drugs available, Suction available, Timeout performed and Patient being monitored Patient Re-evaluated:Patient Re-evaluated prior to induction Oxygen Delivery Method: Nasal cannula Placement Confirmation: positive ETCO2

## 2016-08-17 ENCOUNTER — Encounter: Payer: Self-pay | Admitting: Gastroenterology

## 2016-09-06 ENCOUNTER — Encounter: Payer: Self-pay | Admitting: Physician Assistant

## 2016-09-06 ENCOUNTER — Ambulatory Visit (INDEPENDENT_AMBULATORY_CARE_PROVIDER_SITE_OTHER): Payer: 59 | Admitting: Physician Assistant

## 2016-09-06 VITALS — BP 100/60 | HR 64 | Temp 97.9°F | Resp 16 | Ht 60.0 in | Wt 140.8 lb

## 2016-09-06 DIAGNOSIS — E7212 Methylenetetrahydrofolate reductase deficiency: Secondary | ICD-10-CM

## 2016-09-06 DIAGNOSIS — E559 Vitamin D deficiency, unspecified: Secondary | ICD-10-CM

## 2016-09-06 DIAGNOSIS — Z1589 Genetic susceptibility to other disease: Secondary | ICD-10-CM

## 2016-09-06 DIAGNOSIS — Z13220 Encounter for screening for lipoid disorders: Secondary | ICD-10-CM

## 2016-09-06 DIAGNOSIS — Z7689 Persons encountering health services in other specified circumstances: Secondary | ICD-10-CM | POA: Diagnosis not present

## 2016-09-06 DIAGNOSIS — E663 Overweight: Secondary | ICD-10-CM

## 2016-09-06 DIAGNOSIS — R7309 Other abnormal glucose: Secondary | ICD-10-CM

## 2016-09-06 DIAGNOSIS — E034 Atrophy of thyroid (acquired): Secondary | ICD-10-CM

## 2016-09-06 DIAGNOSIS — R5382 Chronic fatigue, unspecified: Secondary | ICD-10-CM

## 2016-09-06 DIAGNOSIS — Z136 Encounter for screening for cardiovascular disorders: Secondary | ICD-10-CM | POA: Diagnosis not present

## 2016-09-06 NOTE — Patient Instructions (Signed)
Health Maintenance for Postmenopausal Women Menopause is a normal process in which your reproductive ability comes to an end. This process happens gradually over a span of months to years, usually between the ages of 22 and 9. Menopause is complete when you have missed 12 consecutive menstrual periods. It is important to talk with your health care provider about some of the most common conditions that affect postmenopausal women, such as heart disease, cancer, and bone loss (osteoporosis). Adopting a healthy lifestyle and getting preventive care can help to promote your health and wellness. Those actions can also lower your chances of developing some of these common conditions. What should I know about menopause? During menopause, you may experience a number of symptoms, such as:  Moderate-to-severe hot flashes.  Night sweats.  Decrease in sex drive.  Mood swings.  Headaches.  Tiredness.  Irritability.  Memory problems.  Insomnia.  Choosing to treat or not to treat menopausal changes is an individual decision that you make with your health care provider. What should I know about hormone replacement therapy and supplements? Hormone therapy products are effective for treating symptoms that are associated with menopause, such as hot flashes and night sweats. Hormone replacement carries certain risks, especially as you become older. If you are thinking about using estrogen or estrogen with progestin treatments, discuss the benefits and risks with your health care provider. What should I know about heart disease and stroke? Heart disease, heart attack, and stroke become more likely as you age. This may be due, in part, to the hormonal changes that your body experiences during menopause. These can affect how your body processes dietary fats, triglycerides, and cholesterol. Heart attack and stroke are both medical emergencies. There are many things that you can do to help prevent heart disease  and stroke:  Have your blood pressure checked at least every 1-2 years. High blood pressure causes heart disease and increases the risk of stroke.  If you are 53-22 years old, ask your health care provider if you should take aspirin to prevent a heart attack or a stroke.  Do not use any tobacco products, including cigarettes, chewing tobacco, or electronic cigarettes. If you need help quitting, ask your health care provider.  It is important to eat a healthy diet and maintain a healthy weight. ? Be sure to include plenty of vegetables, fruits, low-fat dairy products, and lean protein. ? Avoid eating foods that are high in solid fats, added sugars, or salt (sodium).  Get regular exercise. This is one of the most important things that you can do for your health. ? Try to exercise for at least 150 minutes each week. The type of exercise that you do should increase your heart rate and make you sweat. This is known as moderate-intensity exercise. ? Try to do strengthening exercises at least twice each week. Do these in addition to the moderate-intensity exercise.  Know your numbers.Ask your health care provider to check your cholesterol and your blood glucose. Continue to have your blood tested as directed by your health care provider.  What should I know about cancer screening? There are several types of cancer. Take the following steps to reduce your risk and to catch any cancer development as early as possible. Breast Cancer  Practice breast self-awareness. ? This means understanding how your breasts normally appear and feel. ? It also means doing regular breast self-exams. Let your health care provider know about any changes, no matter how small.  If you are 40  or older, have a clinician do a breast exam (clinical breast exam or CBE) every year. Depending on your age, family history, and medical history, it may be recommended that you also have a yearly breast X-ray (mammogram).  If you  have a family history of breast cancer, talk with your health care provider about genetic screening.  If you are at high risk for breast cancer, talk with your health care provider about having an MRI and a mammogram every year.  Breast cancer (BRCA) gene test is recommended for women who have family members with BRCA-related cancers. Results of the assessment will determine the need for genetic counseling and BRCA1 and for BRCA2 testing. BRCA-related cancers include these types: ? Breast. This occurs in males or females. ? Ovarian. ? Tubal. This may also be called fallopian tube cancer. ? Cancer of the abdominal or pelvic lining (peritoneal cancer). ? Prostate. ? Pancreatic.  Cervical, Uterine, and Ovarian Cancer Your health care provider may recommend that you be screened regularly for cancer of the pelvic organs. These include your ovaries, uterus, and vagina. This screening involves a pelvic exam, which includes checking for microscopic changes to the surface of your cervix (Pap test).  For women ages 21-65, health care providers may recommend a pelvic exam and a Pap test every three years. For women ages 79-65, they may recommend the Pap test and pelvic exam, combined with testing for human papilloma virus (HPV), every five years. Some types of HPV increase your risk of cervical cancer. Testing for HPV may also be done on women of any age who have unclear Pap test results.  Other health care providers may not recommend any screening for nonpregnant women who are considered low risk for pelvic cancer and have no symptoms. Ask your health care provider if a screening pelvic exam is right for you.  If you have had past treatment for cervical cancer or a condition that could lead to cancer, you need Pap tests and screening for cancer for at least 20 years after your treatment. If Pap tests have been discontinued for you, your risk factors (such as having a new sexual partner) need to be  reassessed to determine if you should start having screenings again. Some women have medical problems that increase the chance of getting cervical cancer. In these cases, your health care provider may recommend that you have screening and Pap tests more often.  If you have a family history of uterine cancer or ovarian cancer, talk with your health care provider about genetic screening.  If you have vaginal bleeding after reaching menopause, tell your health care provider.  There are currently no reliable tests available to screen for ovarian cancer.  Lung Cancer Lung cancer screening is recommended for adults 69-62 years old who are at high risk for lung cancer because of a history of smoking. A yearly low-dose CT scan of the lungs is recommended if you:  Currently smoke.  Have a history of at least 30 pack-years of smoking and you currently smoke or have quit within the past 15 years. A pack-year is smoking an average of one pack of cigarettes per day for one year.  Yearly screening should:  Continue until it has been 15 years since you quit.  Stop if you develop a health problem that would prevent you from having lung cancer treatment.  Colorectal Cancer  This type of cancer can be detected and can often be prevented.  Routine colorectal cancer screening usually begins at  age 42 and continues through age 45.  If you have risk factors for colon cancer, your health care provider may recommend that you be screened at an earlier age.  If you have a family history of colorectal cancer, talk with your health care provider about genetic screening.  Your health care provider may also recommend using home test kits to check for hidden blood in your stool.  A small camera at the end of a tube can be used to examine your colon directly (sigmoidoscopy or colonoscopy). This is done to check for the earliest forms of colorectal cancer.  Direct examination of the colon should be repeated every  5-10 years until age 71. However, if early forms of precancerous polyps or small growths are found or if you have a family history or genetic risk for colorectal cancer, you may need to be screened more often.  Skin Cancer  Check your skin from head to toe regularly.  Monitor any moles. Be sure to tell your health care provider: ? About any new moles or changes in moles, especially if there is a change in a mole's shape or color. ? If you have a mole that is larger than the size of a pencil eraser.  If any of your family members has a history of skin cancer, especially at a young age, talk with your health care provider about genetic screening.  Always use sunscreen. Apply sunscreen liberally and repeatedly throughout the day.  Whenever you are outside, protect yourself by wearing long sleeves, pants, a wide-brimmed hat, and sunglasses.  What should I know about osteoporosis? Osteoporosis is a condition in which bone destruction happens more quickly than new bone creation. After menopause, you may be at an increased risk for osteoporosis. To help prevent osteoporosis or the bone fractures that can happen because of osteoporosis, the following is recommended:  If you are 46-71 years old, get at least 1,000 mg of calcium and at least 600 mg of vitamin D per day.  If you are older than age 55 but younger than age 65, get at least 1,200 mg of calcium and at least 600 mg of vitamin D per day.  If you are older than age 54, get at least 1,200 mg of calcium and at least 800 mg of vitamin D per day.  Smoking and excessive alcohol intake increase the risk of osteoporosis. Eat foods that are rich in calcium and vitamin D, and do weight-bearing exercises several times each week as directed by your health care provider. What should I know about how menopause affects my mental health? Depression may occur at any age, but it is more common as you become older. Common symptoms of depression  include:  Low or sad mood.  Changes in sleep patterns.  Changes in appetite or eating patterns.  Feeling an overall lack of motivation or enjoyment of activities that you previously enjoyed.  Frequent crying spells.  Talk with your health care provider if you think that you are experiencing depression. What should I know about immunizations? It is important that you get and maintain your immunizations. These include:  Tetanus, diphtheria, and pertussis (Tdap) booster vaccine.  Influenza every year before the flu season begins.  Pneumonia vaccine.  Shingles vaccine.  Your health care provider may also recommend other immunizations. This information is not intended to replace advice given to you by your health care provider. Make sure you discuss any questions you have with your health care provider. Document Released: 02/19/2005  Document Revised: 07/18/2015 Document Reviewed: 10/01/2014 Elsevier Interactive Patient Education  2018 [employer]  

## 2016-09-06 NOTE — Progress Notes (Signed)
Patient: Alicia Brady Female    DOB: 17-Aug-1955   61 y.o.   MRN: [id number] Visit Date: 09/06/2016  Today's Provider: Margaretann Loveless, PA-C   Chief Complaint  Patient presents with  . Establish Care  . New Patient (Initial Visit)   Subjective:    HPI Patient is here today to establish care as new patient.   Hypothyroid:  TSH  Date Value Ref Range Status  06/11/2011 1.81 0.35 - 5.50 uIU/mL Final  10/07/2010 2.51 0.35 - 5.50 uIU/mL Final   Wt Readings from Last 3 Encounters:  09/06/16 140 lb 12.8 oz (63.9 kg)  08/16/16 137 lb (62.1 kg)  06/16/15 143 lb 2 oz (64.9 kg)   She is not exercising. She is experiencing none She denies change in energy level, diarrhea, heat / cold intolerance, nervousness, palpitations and weight changes Weight trend: stable ------------------------------------------------------------------------ Patient is currently not taking the Armour thyroid, Vit D3 or MBA (B12, B6, MMA supplement for her MTHFR gene mutation).   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 He developed an AVM of the spinal cord (around L1-L2) and lost the ability to walk well approx 12 years ago. He is currently using a wheelchair to ambulate. He does have a spinal stimulator and pain pump. He also was recently diagnosed with prostate cancer last year. She feels she is handling these stressors well at this time.     Allergies  Allergen Reactions  . Levaquin [Levofloxacin] Nausea Only    Dizziness     Current Outpatient Prescriptions:  .  ARMOUR THYROID 60 MG tablet, Take 1 tablet by mouth daily., Disp: , Rfl: 3 .  Cholecalciferol (VITAMIN D3 PO), Take 1 capsule by mouth 2 (two) times a week. 25,000iu, Disp: , Rfl:   Review of Systems  Constitutional: Negative.   HENT: Negative.   Eyes: Negative.   Respiratory: Negative.   Cardiovascular: Negative.   Gastrointestinal: Negative.   Endocrine: Negative.   Genitourinary: Negative.     Musculoskeletal: Negative.   Allergic/Immunologic: Negative.   Neurological: Negative.   Hematological: Negative.   Psychiatric/Behavioral: Negative.   All other systems reviewed and are negative.   Social History  Substance Use Topics  . Smoking status: Never Smoker  . Smokeless tobacco: Never Used  . Alcohol use No   Objective:   BP 100/60 (BP Location: Left Arm, Patient Position: Sitting, Cuff Size: Normal)   Pulse 64   Temp 97.9 F (36.6 C) (Oral)   Resp 16   Ht 5' (1.524 m)   Wt 140 lb 12.8 oz (63.9 kg)   SpO2 96%   BMI 27.50 kg/m    Physical Exam  Constitutional: She is oriented to person, place, and time. She appears well-developed and well-nourished. No distress.  HENT:  Head: Normocephalic and atraumatic.  Right Ear: Hearing, tympanic membrane, external ear and ear canal normal.  Left Ear: Hearing, tympanic membrane, external ear and ear canal normal.  Nose: Nose normal.  Mouth/Throat: Uvula is midline, oropharynx is clear and moist and mucous membranes are normal. No oropharyngeal exudate.  Eyes: Pupils are equal, round, and reactive to light. Conjunctivae and EOM are normal. Right eye exhibits no discharge. Left eye exhibits no discharge. No scleral icterus.  Neck: Normal range of motion. Neck supple. No JVD present. Carotid bruit is not present. No tracheal deviation present. No thyromegaly present.  Cardiovascular: Normal rate, regular rhythm, normal heart sounds and intact distal pulses.  Exam reveals  no gallop and no friction rub.   No murmur heard. Pulmonary/Chest: Effort normal and breath sounds normal. No respiratory distress. She has no wheezes. She has no rales. She exhibits no tenderness.  Abdominal: Soft. Bowel sounds are normal. She exhibits no distension and no mass. There is no tenderness. There is no rebound and no guarding.  Musculoskeletal: Normal range of motion. She exhibits no edema or tenderness.  Lymphadenopathy:    She has no cervical  adenopathy.  Neurological: She is alert and oriented to person, place, and time.  Skin: Skin is warm and dry. No rash noted. She is not diaphoretic.  Psychiatric: She has a normal mood and affect. Her behavior is normal. Judgment and thought content normal.  Vitals reviewed.      Assessment & Plan:     1. Establishing care with new doctor, encounter for Establishing from Dr. Darrick Huntsman (PCP) and from Julio Sicks, RN ([facility]). Medical release form completed by patient to request records from [facility]. CPE should be due sometime in July 2019.   2. Hypothyroidism due to acquired atrophy of thyroid Currently not on therapy and no symptoms. Will check labs as below and f/u pending results. - CBC w/Diff/Platelet - Comprehensive Metabolic Panel (CMET) - TSH  3. Vitamin D deficiency H/O this. Patient stopped Vit D supplementation. Will check labs as below and f/u pending results. - CBC w/Diff/Platelet - Comprehensive Metabolic Panel (CMET) - Vitamin D (25 hydroxy)  4. Overweight (BMI 25.0-29.9) Counseled patient on healthy lifestyle modifications including dieting and exercise.  Will check labs as below and f/u pending results. - CBC w/Diff/Platelet - Comprehensive Metabolic Panel (CMET) - TSH - Lipid Profile - HgB A1c  5. Chronic fatigue Suspect due to stressors of being a caregiver for her husband. Will check labs as below and f/u pending results. - CBC w/Diff/Platelet - Comprehensive Metabolic Panel (CMET) - TSH - Vitamin D (25 hydroxy)  6. Elevated hemoglobin A1c H/O elevated A1c approx 2 years ago and has not been rechecked.  - HgB A1c  7. Encounter for lipid screening for cardiovascular disease Will check labs as below and f/u pending results. - Lipid Profile  8. MTHFR gene mutation (HCC) Known MTHFR mutation in [id number] variant found in lab work done by a wellness center in 2014. Had been on a supplement called MBA, which she has discontinued. Will  check labs as below and f/u pending results. No child in family has ever been born with a neural tube defect, nor any family history of blood clots known. Patient had thought she had a DVT in 03/2015 that had negative workup. I will check labs as below to see if B complex or that [facility] supplement is still required.  - B12       Margaretann Loveless, PA-C  [facility]

## 2016-09-07 ENCOUNTER — Telehealth: Payer: Self-pay

## 2016-09-07 LAB — COMPREHENSIVE METABOLIC PANEL
A/G RATIO: 1.6 (ref 1.2–2.2)
ALT: 13 IU/L (ref 0–32)
AST: 24 IU/L (ref 0–40)
Albumin: 4.5 g/dL (ref 3.6–4.8)
Alkaline Phosphatase: 91 IU/L (ref 39–117)
BUN/Creatinine Ratio: 9 — ABNORMAL LOW (ref 12–28)
BUN: 8 mg/dL (ref 8–27)
Bilirubin Total: 0.7 mg/dL (ref 0.0–1.2)
CO2: 22 mmol/L (ref 20–29)
CREATININE: 0.92 mg/dL (ref 0.57–1.00)
Calcium: 9.8 mg/dL (ref 8.7–10.3)
Chloride: 104 mmol/L (ref 96–106)
GFR, EST AFRICAN AMERICAN: 78 mL/min/{1.73_m2} (ref >59)
GFR, EST NON AFRICAN AMERICAN: 67 mL/min/{1.73_m2} (ref >59)
Globulin, Total: 2.9 g/dL (ref 1.5–4.5)
Glucose: 86 mg/dL (ref 65–99)
POTASSIUM: 4.2 mmol/L (ref 3.5–5.2)
SODIUM: 143 mmol/L (ref 134–144)
TOTAL PROTEIN: 7.4 g/dL (ref 6.0–8.5)

## 2016-09-07 LAB — LIPID PANEL
CHOL/HDL RATIO: 3.2 ratio (ref 0.0–4.4)
Cholesterol, Total: 209 mg/dL — ABNORMAL HIGH (ref 100–199)
HDL: 66 mg/dL (ref >39)
LDL CALC: 128 mg/dL — AB (ref 0–99)
TRIGLYCERIDES: 76 mg/dL (ref 0–149)
VLDL CHOLESTEROL CAL: 15 mg/dL (ref 5–40)

## 2016-09-07 LAB — VITAMIN D 25 HYDROXY (VIT D DEFICIENCY, FRACTURES): VIT D 25 HYDROXY: 51.6 ng/mL (ref 30.0–100.0)

## 2016-09-07 LAB — CBC WITH DIFFERENTIAL/PLATELET
BASOS: 1 %
Basophils Absolute: 0.1 10*3/uL (ref 0.0–0.2)
EOS (ABSOLUTE): 0.1 10*3/uL (ref 0.0–0.4)
Eos: 2 %
Hematocrit: 39.5 % (ref 34.0–46.6)
Hemoglobin: 13.1 g/dL (ref 11.1–15.9)
Immature Grans (Abs): 0 10*3/uL (ref 0.0–0.1)
Immature Granulocytes: 0 %
LYMPHS ABS: 2.3 10*3/uL (ref 0.7–3.1)
Lymphs: 29 %
MCH: 30.1 pg (ref 26.6–33.0)
MCHC: 33.2 g/dL (ref 31.5–35.7)
MCV: 91 fL (ref 79–97)
MONOS ABS: 0.4 10*3/uL (ref 0.1–0.9)
Monocytes: 5 %
NEUTROS ABS: 5.2 10*3/uL (ref 1.4–7.0)
Neutrophils: 63 %
PLATELETS: 290 10*3/uL (ref 150–379)
RBC: 4.35 x10E6/uL (ref 3.77–5.28)
RDW: 14.3 % (ref 12.3–15.4)
WBC: 8.1 10*3/uL (ref 3.4–10.8)

## 2016-09-07 LAB — TSH: TSH: 4.57 u[IU]/mL — ABNORMAL HIGH (ref 0.450–4.500)

## 2016-09-07 LAB — VITAMIN B12: Vitamin B-12: 547 pg/mL (ref 232–1245)

## 2016-09-07 LAB — HEMOGLOBIN A1C
Est. average glucose Bld gHb Est-mCnc: 114 mg/dL
Hgb A1c MFr Bld: 5.6 % (ref 4.8–5.6)

## 2016-09-07 NOTE — Telephone Encounter (Signed)
Unfortunately you probably should not cut in half since it is what we call a narrow index medication. She can start the 60mg  and we can just recheck TSH in 4 weeks. If TSH is overcorrected then I will decrease the dose.

## 2016-09-07 NOTE — Telephone Encounter (Signed)
-----   Message from Margaretann Loveless, [place] sent at 09/07/2016  8:46 AM EDT ----- Cholesterol is borderline high but your 10 yr ASCVD risk is low at 2.09%, which means there is only a 2% chance you would have a cardiac related event in 10 years based on your cholesterol. This means there is no need to add cholesterol lowering medications. Work on healthy lifestyle modifications such as healthy dieting and increasing physical activity to try to get in 150 min of moderate activity per week. Also Vit D and B12 are normal. No need for supplementation. TSH is borderline high at 4.570, cut off for high normal is 4.500. At this level if you are not experiencing fatigue, weight gain, sluggishness then there is no need to add thyroid medication at this time. If you are having symptoms I can restart Armour thyroid, but I do not think you will need the 60mg  dose. We can continue to monitor this level whether you wish to start medication or not. All other labs are WNL and stable.

## 2016-09-07 NOTE — Telephone Encounter (Signed)
Patient reports that she is experiencing fatigue and sluggishness and she would like to start back on armour thyroid. She was wanting to know if she could cut the dose in half and take that since she has a full bottle of the 60mg  tablets? Please advise.  Patient also says that you can message her through [place] as well. Thanks!

## 2016-10-27 ENCOUNTER — Telehealth: Payer: Self-pay | Admitting: Physician Assistant

## 2016-10-27 NOTE — Telephone Encounter (Signed)
[facility] [place] [facility] [place]

## 2016-12-05 IMAGING — US US EXTREM LOW VENOUS*L*
1 series · 13 of 24 positions shown · non-contrast
Comparison: None.

CLINICAL DATA: 59-year-old female history of calf pain



[Series 1: us extrem low venous*left* · 0.07mm/px · 13 of 48 slices shown]
[im 1/48]
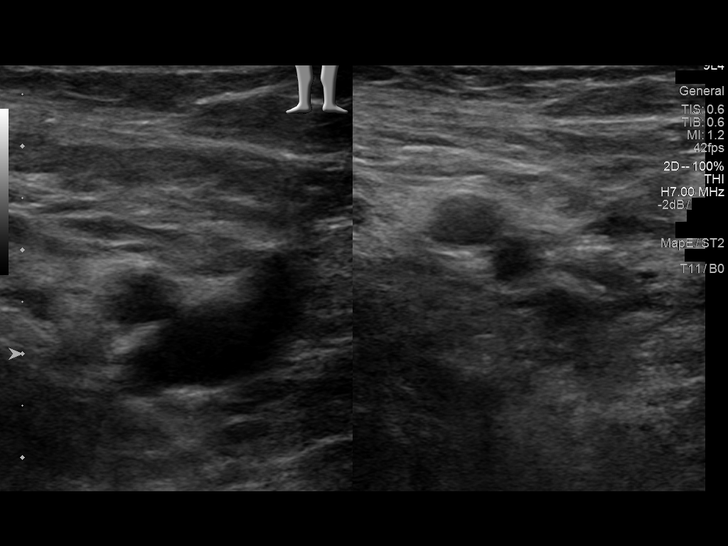
[im 5/48]
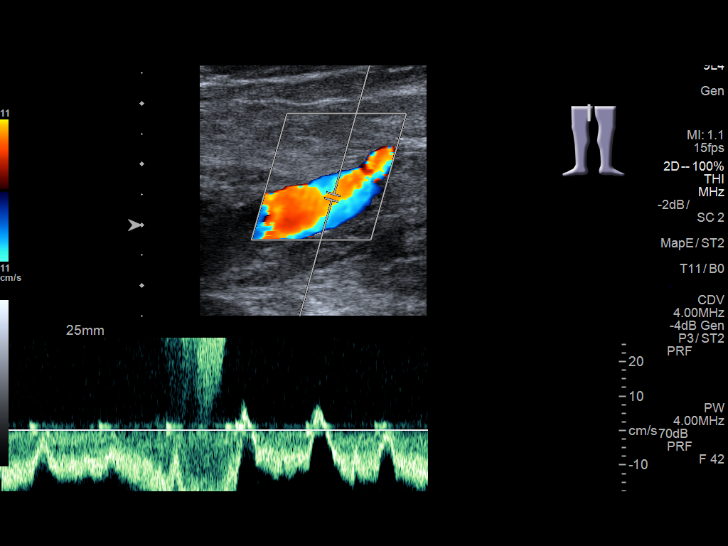
[im 9/48]
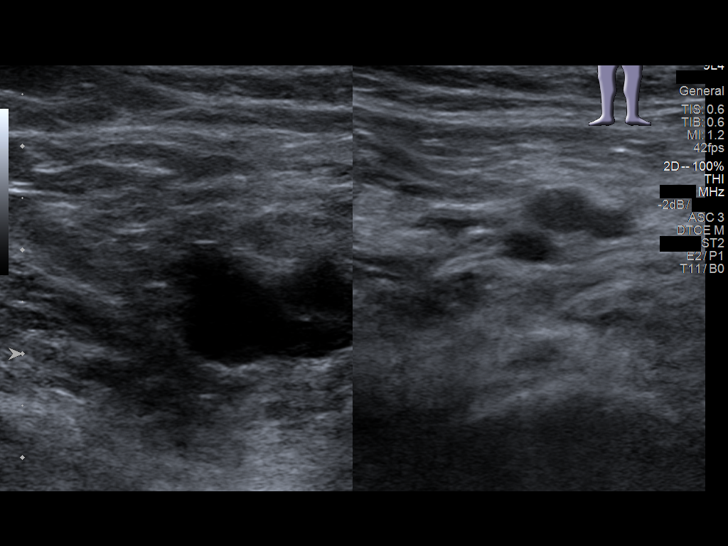
[im 13/48]
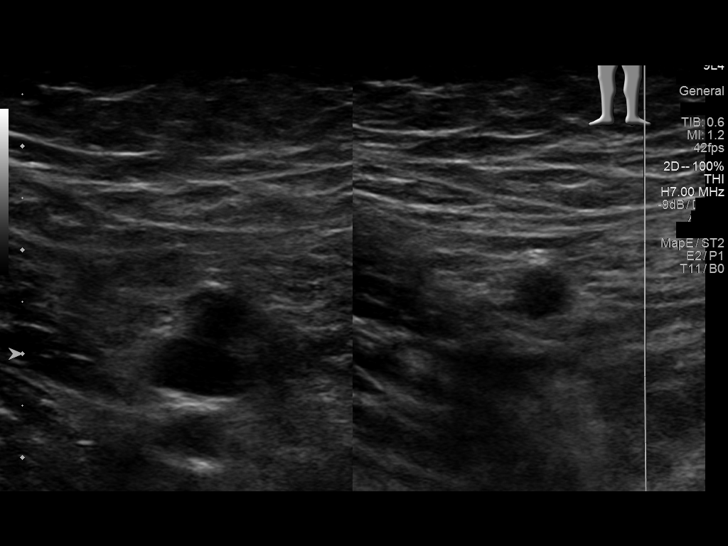
[im 17/48]
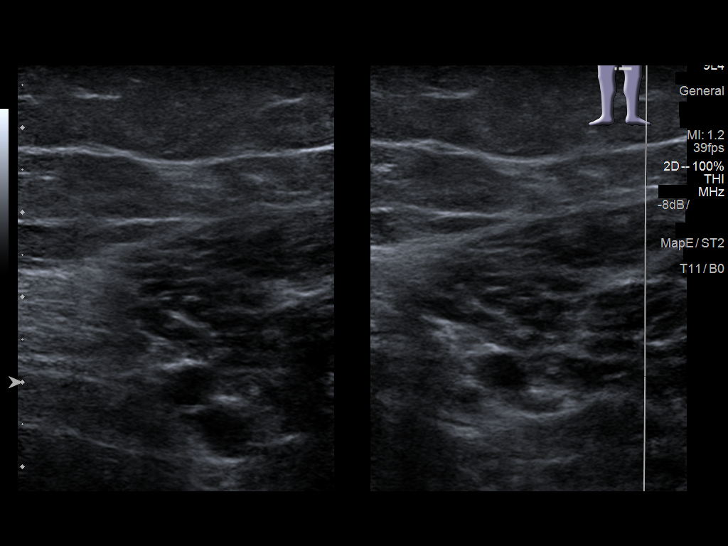
[im 21/48]
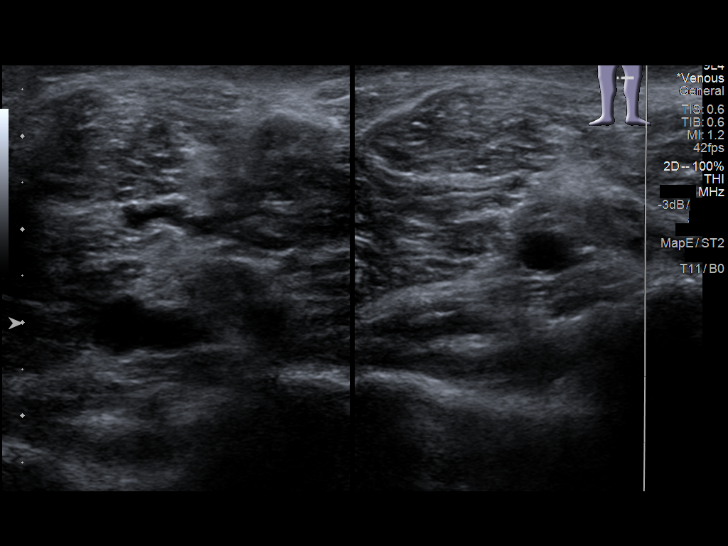
[im 25/48]
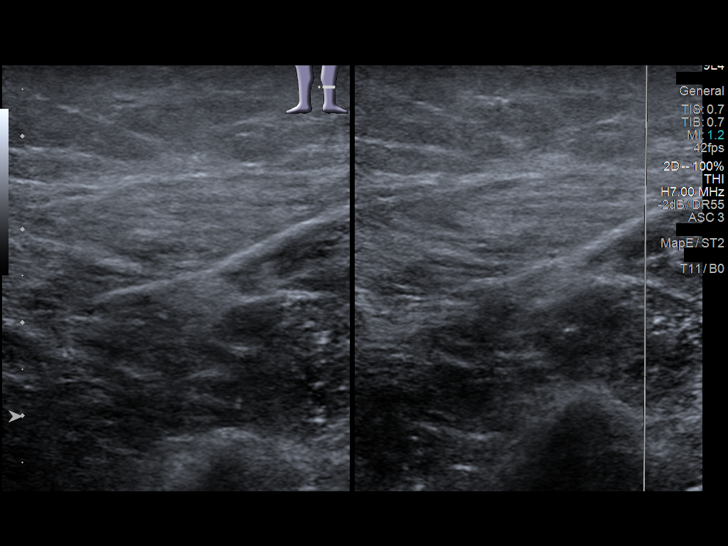
[im 27/48]
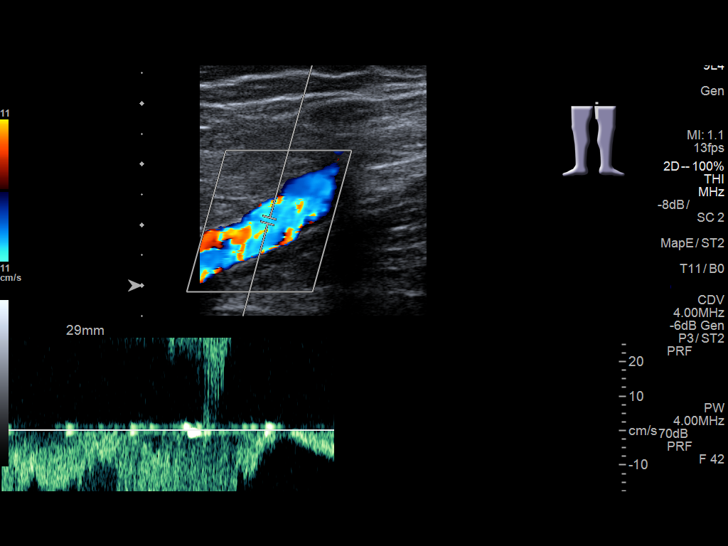
[im 31/48]
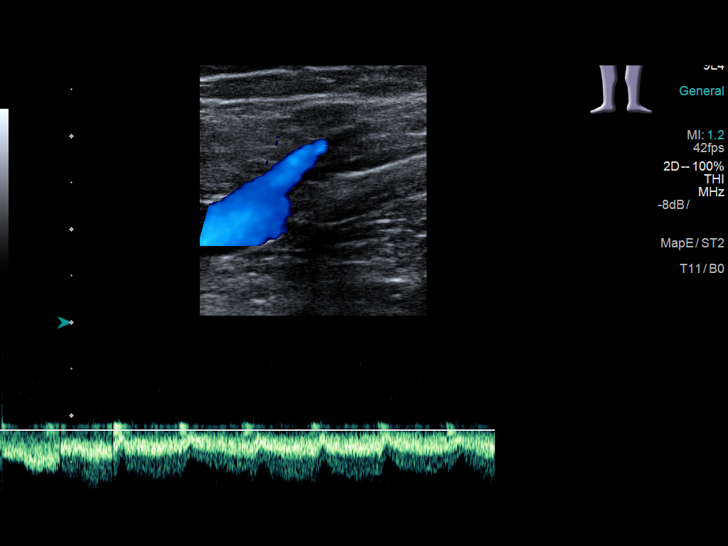
[im 35/48]
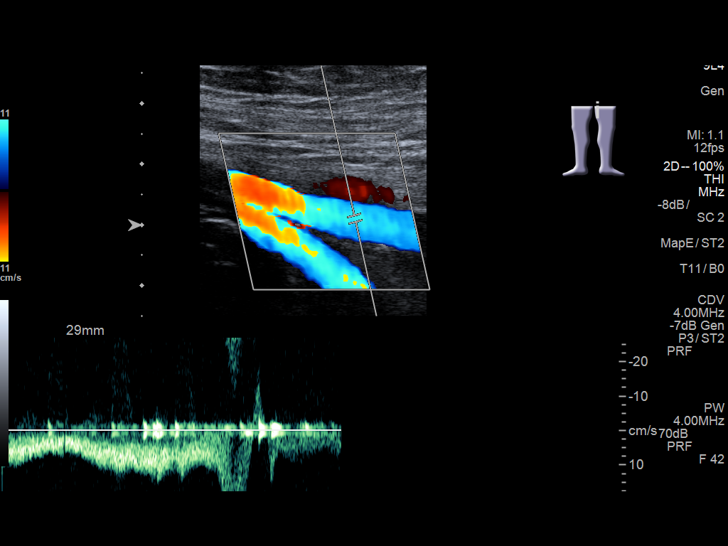
[im 39/48]
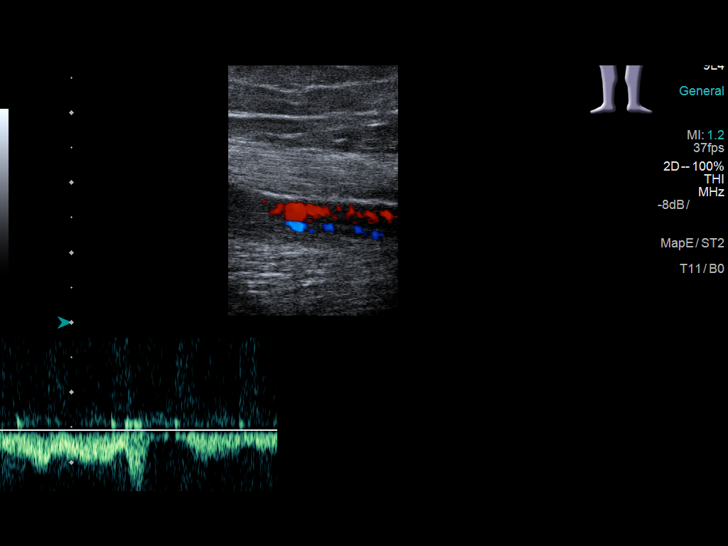
[im 43/48]
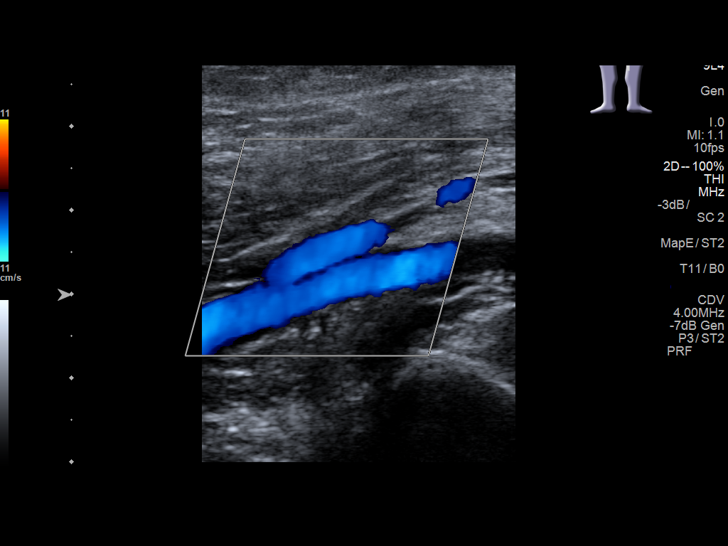
[im 48/48]
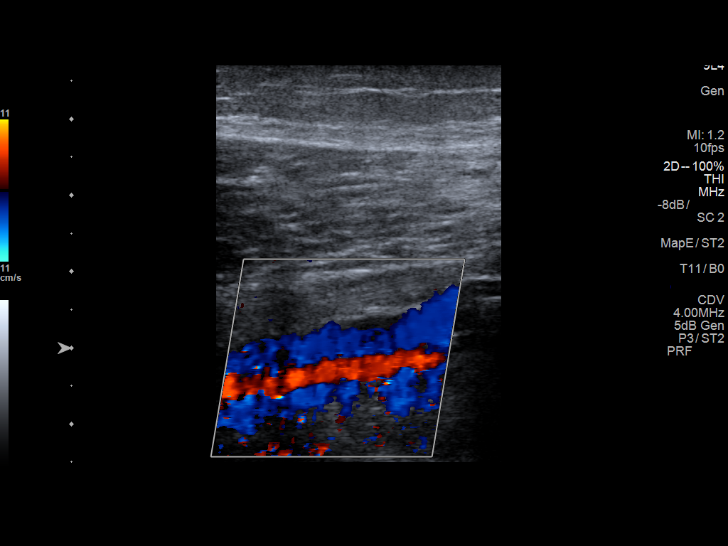

[13 of 24 positions shown; findings below may reference images not displayed]

FINDINGS: Contralateral Common Femoral Vein: Respiratory phasicity is normal
and symmetric with the symptomatic side. No evidence of thrombus.
Normal compressibility.

Common Femoral Vein: No evidence of thrombus. Normal
compressibility, respiratory phasicity and response to augmentation.

Saphenofemoral Junction: No evidence of thrombus. Normal
compressibility and flow on color Doppler imaging.

Profunda Femoral Vein: No evidence of thrombus. Normal
compressibility and flow on color Doppler imaging.

Femoral Vein: No evidence of thrombus. Normal compressibility,
respiratory phasicity and response to augmentation.

Popliteal Vein: No evidence of thrombus. Normal compressibility,
respiratory phasicity and response to augmentation.

Calf Veins: No evidence of thrombus. Normal compressibility and flow
on color Doppler imaging.

Superficial Great Saphenous Vein: No evidence of thrombus. Normal
compressibility and flow on color Doppler imaging.

Other Findings:  None.
IMPRESSION: Sonographic survey of the left lower extremity negative for DVT.

## 2017-02-22 ENCOUNTER — Telehealth: Payer: Self-pay | Admitting: Physician Assistant

## 2017-02-22 NOTE — Telephone Encounter (Signed)
Patient called stating that she is having a lot of tenderness in breast.  She states that it is her whole breast.  She would like to know if there was anything on her last mammogram that might indicate that she would have any pain.  She would like to speak to some one about this.

## 2017-02-22 NOTE — Telephone Encounter (Signed)
Last mammogram was from 06/2016 and completely normal. There are multiple things that could cause breast pain such as a cyst, infection, shingles, etc. I would recommend patient come in for breast exam and can order US if something abnormal felt.

## 2017-02-22 NOTE — Telephone Encounter (Signed)
Patient advised as below. Patient will call back to schedule appt. sd 

## 2017-02-24 ENCOUNTER — Encounter: Payer: Self-pay | Admitting: Physician Assistant

## 2017-02-24 ENCOUNTER — Ambulatory Visit: Payer: 59 | Admitting: Physician Assistant

## 2017-02-24 VITALS — BP 120/80 | HR 62 | Temp 98.2°F | Resp 16 | Wt 146.6 lb

## 2017-02-24 DIAGNOSIS — N644 Mastodynia: Secondary | ICD-10-CM

## 2017-02-24 DIAGNOSIS — R635 Abnormal weight gain: Secondary | ICD-10-CM

## 2017-02-24 DIAGNOSIS — E039 Hypothyroidism, unspecified: Secondary | ICD-10-CM | POA: Diagnosis not present

## 2017-02-24 DIAGNOSIS — Z6828 Body mass index (BMI) 28.0-28.9, adult: Secondary | ICD-10-CM

## 2017-02-24 NOTE — Patient Instructions (Signed)

## 2017-02-24 NOTE — Progress Notes (Signed)
       Patient: Alicia NashKathy B Slaydon Female    DOB: [birth year]   62 y.o.   MRN: [id number] Visit Date: 02/24/2017  Today's Provider: Margaretann LovelessJennifer M Burnette, PA-C   Chief Complaint  Patient presents with  . Breast Pain   Subjective:    HPI Alicia Brady is here today with c/o of breast aching, bilateral. She reports that her breast feel heavy and aching. She reports that she does self breast exam but not sure if doing them correctly. No family history of breast cancer.   She also reports discontinuing her armour thyroid a few months ago because her thyroid levels were normal with a wellness through work. She has had 7 pounds of weight gain since discontinuing.     Allergies  Allergen Reactions  . Levaquin [Levofloxacin] Nausea Only    Dizziness     Current Outpatient Medications:  .  ARMOUR THYROID 60 MG tablet, Take 1 tablet by mouth daily., Disp: , Rfl: 3 .  Cholecalciferol (VITAMIN D3 PO), Take 1 capsule by mouth 2 (two) times a week. 25,000iu, Disp: , Rfl:   Review of Systems  Constitutional: Negative.   Respiratory: Negative.   Cardiovascular: Negative.        Breast tenderness bilaterally  Gastrointestinal: Negative.   Neurological: Negative.   Psychiatric/Behavioral: Negative.     Social History   Tobacco Use  . Smoking status: Never Smoker  . Smokeless tobacco: Never Used  Substance Use Topics  . Alcohol use: No    Alcohol/week: 0.0 oz   Objective:   BP 120/80 (BP Location: Left Arm, Patient Position: Sitting, Cuff Size: Normal)   Pulse 62   Temp 98.2 F (36.8 C) (Oral)   Resp 16   Wt 146 lb 9.6 oz (66.5 kg)   SpO2 98%   BMI 28.63 kg/m    Physical Exam  Constitutional: She appears well-developed and well-nourished. No distress.  Neck: Normal range of motion. Neck supple. No tracheal deviation present. No thyromegaly present.  Cardiovascular: Normal rate, regular rhythm and normal heart sounds. Exam reveals no gallop and no friction rub.  No murmur  heard. Pulmonary/Chest: Effort normal and breath sounds normal. No respiratory distress. She has no wheezes. She has no rales. Right breast exhibits no inverted nipple, no mass, no nipple discharge, no skin change and no tenderness. Left breast exhibits no inverted nipple, no mass, no nipple discharge, no skin change and no tenderness. Breasts are symmetrical.  Lymphadenopathy:    She has no cervical adenopathy.  Skin: She is not diaphoretic.  Vitals reviewed.      Assessment & Plan:     1. Acquired hypothyroidism Patient stopped Armour thyroid and has since had weight gain and fatigue. Will recheck labs as below to see if patient needs to restart medications. - T4 AND TSH  2. Weight gain Most likely secondary to uncontrolled hypothyroid and bad weather through the winter where she has not been walking as much. Will check labs as below and f/u pending results. - T4 AND TSH  3. Breast pain Feel this is secondary to weight gain and improper bra size. She is going to get resized and work on weight loss. Mammogram is due in June. I will see her back in May 2019 for CPE.  4. BMI 28.0-28.9,adult Counseled patient on healthy lifestyle modifications including dieting and exercise.        Margaretann LovelessJennifer M Burnette, PA-C  [facility]

## 2017-02-25 ENCOUNTER — Telehealth: Payer: Self-pay

## 2017-02-25 DIAGNOSIS — R946 Abnormal results of thyroid function studies: Secondary | ICD-10-CM

## 2017-02-25 LAB — T4 AND TSH
T4, Total: 6.8 ug/dL (ref 4.5–12.0)
TSH: 5.21 u[IU]/mL — ABNORMAL HIGH (ref 0.450–4.500)

## 2017-02-25 NOTE — Telephone Encounter (Signed)
-----   Message from Margaretann LovelessJennifer M Burnette, PA-C sent at 02/25/2017  8:18 AM EST ----- TSH is slowly increasing. Would you be interested in restarting your armour thyroid to see if this helps some of your symptoms? If so we can recheck your thyroid numbers 6-8 weeks after restarting to make sure dose is still appropriate.

## 2017-02-25 NOTE — Telephone Encounter (Signed)
Patient advised as directed below. Per patient she is interested in restarting the armour thyroid. She is going to check to see if she has enough at home otherwise she is going to call back.  Thanks,  -Joseline

## 2017-03-31 ENCOUNTER — Ambulatory Visit: Payer: 59 | Admitting: Physician Assistant

## 2017-03-31 ENCOUNTER — Encounter: Payer: Self-pay | Admitting: Physician Assistant

## 2017-03-31 VITALS — BP 90/80 | HR 86 | Temp 98.6°F | Ht 60.0 in | Wt 145.0 lb

## 2017-03-31 DIAGNOSIS — J301 Allergic rhinitis due to pollen: Secondary | ICD-10-CM | POA: Diagnosis not present

## 2017-03-31 NOTE — Progress Notes (Signed)
       Patient: Alicia Brady Female    DOB: 13-Aug-1955   62 y.o.   MRN: [id number] Visit Date: 03/31/2017  Today's Provider: Margaretann LovelessJennifer M Nina Mondor, PA-C   Chief Complaint  Patient presents with  . URI   Subjective:    HPI Upper Respiratory Infection: Patient complains of symptoms of a URI, possible sinusitis. Symptoms include congestion and cough. Onset of symptoms was 7 days ago, gradually worsening since that time. She also c/o congestion, cough described as worsening over time and productive cough with  green colored sputum for the past 3 days .  She is drinking plenty of fluids. Evaluation to date: none. Treatment to date: cough suppressants and decongestants.     Allergies  Allergen Reactions  . Levaquin [Levofloxacin] Nausea Only    Dizziness     Current Outpatient Medications:  .  acetaminophen (TYLENOL) 325 MG tablet, Take 1 tablet by mouth as needed., Disp: , Rfl:  .  ARMOUR THYROID 60 MG tablet, Take 1 tablet by mouth daily., Disp: , Rfl: 3  Review of Systems  Constitutional: Negative.   HENT: Positive for congestion and postnasal drip.   Respiratory: Positive for cough.   Cardiovascular: Negative.   Gastrointestinal: Negative.   Neurological: Negative.     Social History   Tobacco Use  . Smoking status: Never Smoker  . Smokeless tobacco: Never Used  Substance Use Topics  . Alcohol use: No    Alcohol/week: 0.0 oz   Objective:   BP 90/80 (BP Location: Left Arm, Patient Position: Sitting, Cuff Size: Normal)   Pulse 86   Temp 98.6 F (37 C) (Oral)   Ht 5' (1.524 m)   Wt 145 lb (65.8 kg)   SpO2 95%   BMI 28.32 kg/m  Vitals:   03/31/17 0835  BP: 90/80  Pulse: 86  Temp: 98.6 F (37 C)  TempSrc: Oral  SpO2: 95%  Weight: 145 lb (65.8 kg)  Height: 5' (1.524 m)     Physical Exam  Constitutional: She appears well-developed and well-nourished. No distress.  HENT:  Head: Normocephalic and atraumatic.  Right Ear: Hearing, tympanic membrane,  external ear and ear canal normal.  Left Ear: Hearing, tympanic membrane, external ear and ear canal normal.  Nose: Nose normal.  Mouth/Throat: Uvula is midline, oropharynx is clear and moist and mucous membranes are normal. No oropharyngeal exudate.  Eyes: Pupils are equal, round, and reactive to light. Conjunctivae are normal. Right eye exhibits no discharge. Left eye exhibits no discharge. No scleral icterus.  Neck: Normal range of motion. Neck supple. No tracheal deviation present. No thyromegaly present.  Cardiovascular: Normal rate, regular rhythm and normal heart sounds. Exam reveals no gallop and no friction rub.  No murmur heard. Pulmonary/Chest: Effort normal and breath sounds normal. No stridor. No respiratory distress. She has no wheezes. She has no rales.  Lymphadenopathy:    She has no cervical adenopathy.  Skin: Skin is warm and dry. She is not diaphoretic.  Vitals reviewed.       Assessment & Plan:     1. Seasonal allergic rhinitis due to pollen Suspect allergies. Add flonase sensimist. Call if symptoms worsen.       Margaretann LovelessJennifer M Akyah Lagrange, PA-C  [facility]

## 2017-03-31 NOTE — Patient Instructions (Signed)

## 2017-04-05 ENCOUNTER — Encounter: Payer: Self-pay | Admitting: Physician Assistant

## 2017-04-18 NOTE — Telephone Encounter (Signed)
Received 13 pages on 10/05/16 - [facility]

## 2017-04-20 ENCOUNTER — Ambulatory Visit: Payer: 59 | Admitting: Physician Assistant

## 2017-05-09 ENCOUNTER — Encounter: Payer: Self-pay | Admitting: Physician Assistant

## 2017-05-09 DIAGNOSIS — E039 Hypothyroidism, unspecified: Secondary | ICD-10-CM

## 2017-05-09 MED ORDER — ARMOUR THYROID 60 MG PO TABS: 60.0000 mg | ORAL_TABLET | Freq: Every day | ORAL | 0 refills | Status: DC

## 2017-05-13 ENCOUNTER — Encounter: Payer: Self-pay | Admitting: Physician Assistant

## 2017-05-13 DIAGNOSIS — E039 Hypothyroidism, unspecified: Secondary | ICD-10-CM

## 2017-05-13 MED ORDER — THYROID 30 MG PO TABS: 30.0000 mg | ORAL_TABLET | Freq: Every day | ORAL | 1 refills | Status: DC

## 2017-05-25 ENCOUNTER — Encounter: Payer: Self-pay | Admitting: Physician Assistant

## 2017-05-25 ENCOUNTER — Ambulatory Visit (INDEPENDENT_AMBULATORY_CARE_PROVIDER_SITE_OTHER): Payer: 59 | Admitting: Physician Assistant

## 2017-05-25 ENCOUNTER — Other Ambulatory Visit: Payer: Self-pay

## 2017-05-25 VITALS — BP 110/78 | Ht 60.0 in | Wt 147.0 lb

## 2017-05-25 DIAGNOSIS — Z1159 Encounter for screening for other viral diseases: Secondary | ICD-10-CM

## 2017-05-25 DIAGNOSIS — Z1239 Encounter for other screening for malignant neoplasm of breast: Secondary | ICD-10-CM

## 2017-05-25 DIAGNOSIS — Z6828 Body mass index (BMI) 28.0-28.9, adult: Secondary | ICD-10-CM

## 2017-05-25 DIAGNOSIS — E039 Hypothyroidism, unspecified: Secondary | ICD-10-CM | POA: Diagnosis not present

## 2017-05-25 DIAGNOSIS — E559 Vitamin D deficiency, unspecified: Secondary | ICD-10-CM | POA: Diagnosis not present

## 2017-05-25 DIAGNOSIS — Z0000 Encounter for general adult medical examination without abnormal findings: Secondary | ICD-10-CM

## 2017-05-25 DIAGNOSIS — E7212 Methylenetetrahydrofolate reductase deficiency: Secondary | ICD-10-CM | POA: Diagnosis not present

## 2017-05-25 DIAGNOSIS — Z1231 Encounter for screening mammogram for malignant neoplasm of breast: Secondary | ICD-10-CM | POA: Diagnosis not present

## 2017-05-25 DIAGNOSIS — Z1589 Genetic susceptibility to other disease: Secondary | ICD-10-CM

## 2017-05-25 NOTE — Patient Instructions (Signed)
Health Maintenance for Postmenopausal Women Menopause is a normal process in which your reproductive ability comes to an end. This process happens gradually over a span of months to years, usually between the ages of 22 and 9. Menopause is complete when you have missed 12 consecutive menstrual periods. It is important to talk with your health care provider about some of the most common conditions that affect postmenopausal women, such as heart disease, cancer, and bone loss (osteoporosis). Adopting a healthy lifestyle and getting preventive care can help to promote your health and wellness. Those actions can also lower your chances of developing some of these common conditions. What should I know about menopause? During menopause, you may experience a number of symptoms, such as:  Moderate-to-severe hot flashes.  Night sweats.  Decrease in sex drive.  Mood swings.  Headaches.  Tiredness.  Irritability.  Memory problems.  Insomnia.  Choosing to treat or not to treat menopausal changes is an individual decision that you make with your health care provider. What should I know about hormone replacement therapy and supplements? Hormone therapy products are effective for treating symptoms that are associated with menopause, such as hot flashes and night sweats. Hormone replacement carries certain risks, especially as you become older. If you are thinking about using estrogen or estrogen with progestin treatments, discuss the benefits and risks with your health care provider. What should I know about heart disease and stroke? Heart disease, heart attack, and stroke become more likely as you age. This may be due, in part, to the hormonal changes that your body experiences during menopause. These can affect how your body processes dietary fats, triglycerides, and cholesterol. Heart attack and stroke are both medical emergencies. There are many things that you can do to help prevent heart disease  and stroke:  Have your blood pressure checked at least every 1-2 years. High blood pressure causes heart disease and increases the risk of stroke.  If you are 53-22 years old, ask your health care provider if you should take aspirin to prevent a heart attack or a stroke.  Do not use any tobacco products, including cigarettes, chewing tobacco, or electronic cigarettes. If you need help quitting, ask your health care provider.  It is important to eat a healthy diet and maintain a healthy weight. ? Be sure to include plenty of vegetables, fruits, low-fat dairy products, and lean protein. ? Avoid eating foods that are high in solid fats, added sugars, or salt (sodium).  Get regular exercise. This is one of the most important things that you can do for your health. ? Try to exercise for at least 150 minutes each week. The type of exercise that you do should increase your heart rate and make you sweat. This is known as moderate-intensity exercise. ? Try to do strengthening exercises at least twice each week. Do these in addition to the moderate-intensity exercise.  Know your numbers.Ask your health care provider to check your cholesterol and your blood glucose. Continue to have your blood tested as directed by your health care provider.  What should I know about cancer screening? There are several types of cancer. Take the following steps to reduce your risk and to catch any cancer development as early as possible. Breast Cancer  Practice breast self-awareness. ? This means understanding how your breasts normally appear and feel. ? It also means doing regular breast self-exams. Let your health care provider know about any changes, no matter how small.  If you are 40  or older, have a clinician do a breast exam (clinical breast exam or CBE) every year. Depending on your age, family history, and medical history, it may be recommended that you also have a yearly breast X-ray (mammogram).  If you  have a family history of breast cancer, talk with your health care provider about genetic screening.  If you are at high risk for breast cancer, talk with your health care provider about having an MRI and a mammogram every year.  Breast cancer (BRCA) gene test is recommended for women who have family members with BRCA-related cancers. Results of the assessment will determine the need for genetic counseling and BRCA1 and for BRCA2 testing. BRCA-related cancers include these types: ? Breast. This occurs in males or females. ? Ovarian. ? Tubal. This may also be called fallopian tube cancer. ? Cancer of the abdominal or pelvic lining (peritoneal cancer). ? Prostate. ? Pancreatic.  Cervical, Uterine, and Ovarian Cancer Your health care provider may recommend that you be screened regularly for cancer of the pelvic organs. These include your ovaries, uterus, and vagina. This screening involves a pelvic exam, which includes checking for microscopic changes to the surface of your cervix (Pap test).  For women ages 21-65, health care providers may recommend a pelvic exam and a Pap test every three years. For women ages 79-65, they may recommend the Pap test and pelvic exam, combined with testing for human papilloma virus (HPV), every five years. Some types of HPV increase your risk of cervical cancer. Testing for HPV may also be done on women of any age who have unclear Pap test results.  Other health care providers may not recommend any screening for nonpregnant women who are considered low risk for pelvic cancer and have no symptoms. Ask your health care provider if a screening pelvic exam is right for you.  If you have had past treatment for cervical cancer or a condition that could lead to cancer, you need Pap tests and screening for cancer for at least 20 years after your treatment. If Pap tests have been discontinued for you, your risk factors (such as having a new sexual partner) need to be  reassessed to determine if you should start having screenings again. Some women have medical problems that increase the chance of getting cervical cancer. In these cases, your health care provider may recommend that you have screening and Pap tests more often.  If you have a family history of uterine cancer or ovarian cancer, talk with your health care provider about genetic screening.  If you have vaginal bleeding after reaching menopause, tell your health care provider.  There are currently no reliable tests available to screen for ovarian cancer.  Lung Cancer Lung cancer screening is recommended for adults 69-62 years old who are at high risk for lung cancer because of a history of smoking. A yearly low-dose CT scan of the lungs is recommended if you:  Currently smoke.  Have a history of at least 30 pack-years of smoking and you currently smoke or have quit within the past 15 years. A pack-year is smoking an average of one pack of cigarettes per day for one year.  Yearly screening should:  Continue until it has been 15 years since you quit.  Stop if you develop a health problem that would prevent you from having lung cancer treatment.  Colorectal Cancer  This type of cancer can be detected and can often be prevented.  Routine colorectal cancer screening usually begins at  age 42 and continues through age 45.  If you have risk factors for colon cancer, your health care provider may recommend that you be screened at an earlier age.  If you have a family history of colorectal cancer, talk with your health care provider about genetic screening.  Your health care provider may also recommend using home test kits to check for hidden blood in your stool.  A small camera at the end of a tube can be used to examine your colon directly (sigmoidoscopy or colonoscopy). This is done to check for the earliest forms of colorectal cancer.  Direct examination of the colon should be repeated every  5-10 years until age 71. However, if early forms of precancerous polyps or small growths are found or if you have a family history or genetic risk for colorectal cancer, you may need to be screened more often.  Skin Cancer  Check your skin from head to toe regularly.  Monitor any moles. Be sure to tell your health care provider: ? About any new moles or changes in moles, especially if there is a change in a mole's shape or color. ? If you have a mole that is larger than the size of a pencil eraser.  If any of your family members has a history of skin cancer, especially at a young age, talk with your health care provider about genetic screening.  Always use sunscreen. Apply sunscreen liberally and repeatedly throughout the day.  Whenever you are outside, protect yourself by wearing long sleeves, pants, a wide-brimmed hat, and sunglasses.  What should I know about osteoporosis? Osteoporosis is a condition in which bone destruction happens more quickly than new bone creation. After menopause, you may be at an increased risk for osteoporosis. To help prevent osteoporosis or the bone fractures that can happen because of osteoporosis, the following is recommended:  If you are 46-71 years old, get at least 1,000 mg of calcium and at least 600 mg of vitamin D per day.  If you are older than age 55 but younger than age 65, get at least 1,200 mg of calcium and at least 600 mg of vitamin D per day.  If you are older than age 54, get at least 1,200 mg of calcium and at least 800 mg of vitamin D per day.  Smoking and excessive alcohol intake increase the risk of osteoporosis. Eat foods that are rich in calcium and vitamin D, and do weight-bearing exercises several times each week as directed by your health care provider. What should I know about how menopause affects my mental health? Depression may occur at any age, but it is more common as you become older. Common symptoms of depression  include:  Low or sad mood.  Changes in sleep patterns.  Changes in appetite or eating patterns.  Feeling an overall lack of motivation or enjoyment of activities that you previously enjoyed.  Frequent crying spells.  Talk with your health care provider if you think that you are experiencing depression. What should I know about immunizations? It is important that you get and maintain your immunizations. These include:  Tetanus, diphtheria, and pertussis (Tdap) booster vaccine.  Influenza every year before the flu season begins.  Pneumonia vaccine.  Shingles vaccine.  Your health care provider may also recommend other immunizations. This information is not intended to replace advice given to you by your health care provider. Make sure you discuss any questions you have with your health care provider. Document Released: 02/19/2005  Document Revised: 07/18/2015 Document Reviewed: 10/01/2014 Elsevier Interactive Patient Education  2018 [employer]  

## 2017-05-25 NOTE — Progress Notes (Signed)
Patient: Alicia Brady, Female    DOB: 09-02-1955, 62 y.o.   MRN: [id number] Visit Date: 05/25/2017  Today's Provider: Margaretann Loveless, PA-C   Chief Complaint  Patient presents with  . Annual Exam   Subjective:    Annual physical exam Alicia Brady is a 62 y.o. female who presents today for health maintenance and complete physical. She feels well. She reports exercising walks occasionally. She reports she is sleeping well. -----------------------------------------------------------------   Review of Systems  Constitutional: Negative.   HENT: Positive for congestion and sinus pressure. Negative for ear pain.   Eyes: Negative.   Respiratory: Positive for cough.   Cardiovascular: Negative.   Gastrointestinal: Negative.   Endocrine: Negative.   Genitourinary: Negative.   Musculoskeletal: Negative.   Skin: Negative.   Allergic/Immunologic: Negative.   Neurological: Negative.   Hematological: Negative.   Psychiatric/Behavioral: Negative.     Social History      She  reports that she has never smoked. She has never used smokeless tobacco. She reports that she does not drink alcohol or use drugs.       Social History   Socioeconomic History  . Marital status: Married    Spouse name: Not on file  . Number of children: Not on file  . Years of education: Not on file  . Highest education level: Not on file  Occupational History  . Not on file  Social Needs  . Financial resource strain: Not on file  . Food insecurity:    Worry: Not on file    Inability: Not on file  . Transportation needs:    Medical: Not on file    Non-medical: Not on file  Tobacco Use  . Smoking status: Never Smoker  . Smokeless tobacco: Never Used  Substance and Sexual Activity  . Alcohol use: No    Alcohol/week: 0.0 oz  . Drug use: No  . Sexual activity: Never  Lifestyle  . Physical activity:    Days per week: Not on file    Minutes per session: Not on file  . Stress: Not  on file  Relationships  . Social connections:    Talks on phone: Not on file    Gets together: Not on file    Attends religious service: Not on file    Active member of club or organization: Not on file    Attends meetings of clubs or organizations: Not on file    Relationship status: Not on file  Other Topics Concern  . Not on file  Social History Narrative  . Not on file    Past Medical History:  Diagnosis Date  . Anxiety    managed with alprazolam  . Hypothyroidism 06/17/2015  . Vitamin D deficiency 06/11/2011     Patient Active Problem List   Diagnosis Date Noted  . MTHFR gene mutation (HCC) 09/06/2016  . Hypothyroidism 06/17/2015  . Rectal bleeding 06/17/2015  . Fatigue 01/31/2012  . Overweight (BMI 25.0-29.9) 06/18/2011  . Vitamin D deficiency 06/11/2011  . Anxiety     Past Surgical History:  Procedure Laterality Date  . COLONOSCOPY WITH PROPOFOL N/A 08/16/2016   Procedure: COLONOSCOPY WITH PROPOFOL;  Surgeon: Midge Minium, MD;  Location: [facility];  Service: Gastroenterology;  Laterality: N/A;  . ENDOMETRIAL BIOPSY  01/2010   for spotting, biopsy normal (done in MD office)  . WISDOM TOOTH EXTRACTION      Family History  Family Status  Relation Name Status  . Mother  Deceased  . Father  Deceased  . Sister  (Not Specified)        Her family history includes Alcohol abuse in her mother; Diabetes in her father; Heart disease in her father and sister.      Allergies  Allergen Reactions  . Levaquin [Levofloxacin] Nausea Only    Dizziness     Current Outpatient Medications:  .  acetaminophen (TYLENOL) 325 MG tablet, Take 1 tablet by mouth as needed., Disp: , Rfl:  .  pseudoephedrine (SUDAFED) 30 MG tablet, Take 30 mg by mouth every 4 (four) hours as needed for congestion., Disp: , Rfl:  .  thyroid (ARMOUR THYROID) 30 MG tablet, Take 1 tablet (30 mg total) by mouth daily before breakfast., Disp: 90 tablet, Rfl: 1   Patient Care  Team: Margaretann Loveless, PA-C as PCP - General (Family Medicine)      Objective:   Vitals: BP 110/78   Ht 5' (1.524 m)   Wt 147 lb (66.7 kg)   BMI 28.71 kg/m    Vitals:   05/25/17 0805  BP: 110/78  Weight: 147 lb (66.7 kg)  Height: 5' (1.524 m)     Physical Exam  Constitutional: She is oriented to person, place, and time. She appears well-developed and well-nourished. No distress.  HENT:  Head: Normocephalic and atraumatic.  Right Ear: Hearing, tympanic membrane, external ear and ear canal normal.  Left Ear: Hearing, tympanic membrane and external ear normal.  Nose: Nose normal.  Mouth/Throat: Uvula is midline, oropharynx is clear and moist and mucous membranes are normal. No oropharyngeal exudate.  Eyes: Pupils are equal, round, and reactive to light. Conjunctivae and EOM are normal. Right eye exhibits no discharge. Left eye exhibits no discharge. No scleral icterus.  Neck: Normal range of motion. Neck supple. No JVD present. Carotid bruit is not present. No tracheal deviation present. No thyromegaly present.  Cardiovascular: Normal rate, regular rhythm, normal heart sounds and intact distal pulses. Exam reveals no gallop and no friction rub.  No murmur heard. Pulmonary/Chest: Effort normal and breath sounds normal. No respiratory distress. She has no wheezes. She has no rales. She exhibits no tenderness. Right breast exhibits no inverted nipple, no mass, no nipple discharge, no skin change and no tenderness. Left breast exhibits no inverted nipple, no mass, no nipple discharge, no skin change and no tenderness. No breast swelling, tenderness, discharge or bleeding. Breasts are symmetrical.  Abdominal: Soft. Bowel sounds are normal. She exhibits no distension and no mass. There is no tenderness. There is no rebound and no guarding.  Musculoskeletal: Normal range of motion. She exhibits no edema or tenderness.  Lymphadenopathy:    She has no cervical adenopathy.  Neurological:  She is alert and oriented to person, place, and time.  Skin: Skin is warm and dry. No rash noted. She is not diaphoretic.  Psychiatric: She has a normal mood and affect. Her behavior is normal. Judgment and thought content normal.  Vitals reviewed.    Depression Screen PHQ 2/9 Scores 05/25/2017 09/06/2016  PHQ - 2 Score 0 0      Assessment & Plan:     Routine Health Maintenance and Physical Exam  Exercise Activities and Dietary recommendations Goals    None       There is no immunization history on file for this patient.  Health Maintenance  Topic Date Due  . Hepatitis C Screening  November 30, 1955  . HIV Screening  04/27/1970  . TETANUS/TDAP  05/26/2018 (Originally 04/27/1974)  . MAMMOGRAM  06/30/2018  . PAP SMEAR  06/30/2019  . COLONOSCOPY  08/17/2026     Discussed health benefits of physical activity, and encouraged her to engage in regular exercise appropriate for her age and condition.   1. Annual physical exam Normal physical exam today. Will check labs as below and f/u pending lab results. If labs are stable and WNL she will not need to have these rechecked for one year at her next annual physical exam. She is to call the office in the meantime if she has any acute issue, questions or concerns. - CBC w/Diff/Platelet - Comprehensive Metabolic Panel (CMET) - TSH - Lipid Profile - HgB A1c  2. Breast cancer screening Breast exam today was normal. There is no family history of breast cancer. She does perform regular self breast exams. Mammogram was ordered as below. Information for [facility] was given to patient so she may schedule her mammogram at her convenience. - MM Digital Screening; Future  3. BMI 28.0-28.9,adult Counseled patient on healthy lifestyle modifications including dieting and exercise.  - Comprehensive Metabolic Panel (CMET) - Lipid Profile - HgB A1c  4. Acquired hypothyroidism Will check labs as below and f/u pending results. -  TSH  5. Vitamin D deficiency On OTC supplement. Postmenopausal. Will check labs as below and f/u pending results. - CBC w/Diff/Platelet - Vitamin D (25 hydroxy)  6. MTHFR gene mutation (HCC) Asymptomatic.  7. Encounter for hepatitis C screening test for low risk patient - Hepatitis C Antibody  --------------------------------------------------------------------    Margaretann Loveless, PA-C  [facility]

## 2017-05-26 LAB — LIPID PANEL
CHOLESTEROL TOTAL: 197 mg/dL (ref 100–199)
Chol/HDL Ratio: 3.2 ratio (ref 0.0–4.4)
HDL: 62 mg/dL (ref >39)
LDL Calculated: 118 mg/dL — ABNORMAL HIGH (ref 0–99)
Triglycerides: 84 mg/dL (ref 0–149)
VLDL Cholesterol Cal: 17 mg/dL (ref 5–40)

## 2017-05-26 LAB — COMPREHENSIVE METABOLIC PANEL
ALK PHOS: 88 IU/L (ref 39–117)
ALT: 8 IU/L (ref 0–32)
AST: 19 IU/L (ref 0–40)
Albumin/Globulin Ratio: 1.5 (ref 1.2–2.2)
Albumin: 4.2 g/dL (ref 3.6–4.8)
BUN/Creatinine Ratio: 7 — ABNORMAL LOW (ref 12–28)
BUN: 7 mg/dL — AB (ref 8–27)
Bilirubin Total: 0.5 mg/dL (ref 0.0–1.2)
CO2: 22 mmol/L (ref 20–29)
CREATININE: 0.97 mg/dL (ref 0.57–1.00)
Calcium: 9 mg/dL (ref 8.7–10.3)
Chloride: 108 mmol/L — ABNORMAL HIGH (ref 96–106)
GFR calc Af Amer: 72 mL/min/{1.73_m2} (ref >59)
GFR calc non Af Amer: 63 mL/min/{1.73_m2} (ref >59)
GLOBULIN, TOTAL: 2.8 g/dL (ref 1.5–4.5)
GLUCOSE: 81 mg/dL (ref 65–99)
Potassium: 4.1 mmol/L (ref 3.5–5.2)
SODIUM: 145 mmol/L — AB (ref 134–144)
Total Protein: 7 g/dL (ref 6.0–8.5)

## 2017-05-26 LAB — CBC WITH DIFFERENTIAL/PLATELET
BASOS ABS: 0 10*3/uL (ref 0.0–0.2)
Basos: 1 %
EOS (ABSOLUTE): 0.2 10*3/uL (ref 0.0–0.4)
Eos: 2 %
Hematocrit: 37.2 % (ref 34.0–46.6)
Hemoglobin: 11.6 g/dL (ref 11.1–15.9)
Immature Grans (Abs): 0 10*3/uL (ref 0.0–0.1)
Immature Granulocytes: 0 %
Lymphocytes Absolute: 1.8 10*3/uL (ref 0.7–3.1)
Lymphs: 24 %
MCH: 28.9 pg (ref 26.6–33.0)
MCHC: 31.2 g/dL — ABNORMAL LOW (ref 31.5–35.7)
MCV: 93 fL (ref 79–97)
MONOS ABS: 0.5 10*3/uL (ref 0.1–0.9)
Monocytes: 7 %
NEUTROS PCT: 66 %
Neutrophils Absolute: 5.1 10*3/uL (ref 1.4–7.0)
PLATELETS: 280 10*3/uL (ref 150–379)
RBC: 4.01 x10E6/uL (ref 3.77–5.28)
RDW: 14.3 % (ref 12.3–15.4)
WBC: 7.7 10*3/uL (ref 3.4–10.8)

## 2017-05-26 LAB — HEPATITIS C ANTIBODY

## 2017-05-26 LAB — HEMOGLOBIN A1C
ESTIMATED AVERAGE GLUCOSE: 114 mg/dL
Hgb A1c MFr Bld: 5.6 % (ref 4.8–5.6)

## 2017-05-26 LAB — TSH: TSH: 2.74 u[IU]/mL (ref 0.450–4.500)

## 2017-05-26 LAB — VITAMIN D 25 HYDROXY (VIT D DEFICIENCY, FRACTURES): Vit D, 25-Hydroxy: 46.6 ng/mL (ref 30.0–100.0)

## 2017-06-07 ENCOUNTER — Other Ambulatory Visit: Payer: Self-pay | Admitting: Physician Assistant

## 2017-06-07 ENCOUNTER — Encounter: Payer: Self-pay | Admitting: Physician Assistant

## 2017-06-07 DIAGNOSIS — E039 Hypothyroidism, unspecified: Secondary | ICD-10-CM

## 2017-06-07 MED ORDER — THYROID 30 MG PO TABS: 30.0000 mg | ORAL_TABLET | Freq: Every day | ORAL | 1 refills | Status: DC

## 2017-07-05 ENCOUNTER — Telehealth: Payer: Self-pay

## 2017-07-05 ENCOUNTER — Ambulatory Visit
Admission: RE | Admit: 2017-07-05 | Discharge: 2017-07-05 | Disposition: A | Discharge status: Home / Self Care | Payer: 59 | Source: Ambulatory Visit | Attending: Physician Assistant | Admitting: Physician Assistant

## 2017-07-05 DIAGNOSIS — Z1239 Encounter for other screening for malignant neoplasm of breast: Secondary | ICD-10-CM

## 2017-07-05 DIAGNOSIS — Z1231 Encounter for screening mammogram for malignant neoplasm of breast: Secondary | ICD-10-CM | POA: Diagnosis not present

## 2017-07-05 NOTE — Telephone Encounter (Signed)
Viewed by Amado NashKathy B Mayeda on 07/05/2017 12:01 PM

## 2017-07-05 NOTE — Telephone Encounter (Signed)
-----   Message from Margaretann LovelessJennifer M Burnette, PA-C sent at 07/05/2017 11:10 AM EDT ----- Normal mammogram. Repeat screening in one year.

## 2017-11-02 ENCOUNTER — Other Ambulatory Visit: Payer: Self-pay | Admitting: Physician Assistant

## 2017-11-02 DIAGNOSIS — E039 Hypothyroidism, unspecified: Secondary | ICD-10-CM

## 2017-11-07 ENCOUNTER — Encounter: Payer: Self-pay | Admitting: Physician Assistant

## 2017-11-07 DIAGNOSIS — E039 Hypothyroidism, unspecified: Secondary | ICD-10-CM

## 2017-11-07 MED ORDER — THYROID 30 MG PO TABS: 30.0000 mg | ORAL_TABLET | Freq: Every day | ORAL | 1 refills | Status: DC

## 2018-04-11 ENCOUNTER — Other Ambulatory Visit: Payer: Self-pay | Admitting: Family Medicine

## 2018-04-11 DIAGNOSIS — E039 Hypothyroidism, unspecified: Secondary | ICD-10-CM

## 2018-04-11 NOTE — Telephone Encounter (Signed)
Please review. Patient has not been seen since 05/2017

## 2018-04-13 NOTE — Telephone Encounter (Signed)
See Rx request ° °

## 2018-08-30 ENCOUNTER — Telehealth: Payer: Self-pay | Admitting: Physician Assistant

## 2018-08-30 DIAGNOSIS — Z1239 Encounter for other screening for malignant neoplasm of breast: Secondary | ICD-10-CM

## 2018-08-30 NOTE — Telephone Encounter (Signed)
Needing mammogram referral to [facility]  Thanks, [employer]

## 2018-08-31 NOTE — Telephone Encounter (Signed)
Patient advised as below.  

## 2018-10-03 NOTE — Progress Notes (Signed)
Patient: Alicia Brady, Female    DOB: 27-Sep-1955, 63 y.o.   MRN: [id number] Visit Date: 10/05/2018  Today's Provider: Margaretann Loveless, PA-C   Chief Complaint  Patient presents with  . Annual Exam   Subjective:     I,Alicia Brady,RMA am acting as a Neurosurgeon for [employer], PA-C.   Annual physical exam Alicia Brady is a 63 y.o. female who presents today for health maintenance and complete physical. She feels well. She reports exercising none. She reports she is sleeping fairly well. ----------------------------------------------------------------- Mammogram:10/04/18-Normal  Review of Systems  Constitutional: Positive for fatigue.  HENT: Negative.   Eyes: Negative.   Respiratory: Negative.   Cardiovascular: Negative.   Gastrointestinal: Negative.   Endocrine: Negative.   Genitourinary: Negative.   Musculoskeletal: Negative.   Skin: Negative.   Allergic/Immunologic: Negative.   Neurological: Negative.   Hematological: Negative.   Psychiatric/Behavioral: Positive for sleep disturbance.    Social History      She  reports that she has never smoked. She has never used smokeless tobacco. She reports that she does not drink alcohol or use drugs.       Social History   Socioeconomic History  . Marital status: Married    Spouse name: Not on file  . Number of children: Not on file  . Years of education: Not on file  . Highest education level: Not on file  Occupational History  . Not on file  Social Needs  . Financial resource strain: Not on file  . Food insecurity    Worry: Not on file    Inability: Not on file  . Transportation needs    Medical: Not on file    Non-medical: Not on file  Tobacco Use  . Smoking status: Never Smoker  . Smokeless tobacco: Never Used  Substance and Sexual Activity  . Alcohol use: No    Alcohol/week: 0.0 standard drinks  . Drug use: No  . Sexual activity: Never  Lifestyle  . Physical activity    Days  per week: Not on file    Minutes per session: Not on file  . Stress: Not on file  Relationships  . Social Musician on phone: Not on file    Gets together: Not on file    Attends religious service: Not on file    Active member of club or organization: Not on file    Attends meetings of clubs or organizations: Not on file    Relationship status: Not on file  Other Topics Concern  . Not on file  Social History Narrative  . Not on file    Past Medical History:  Diagnosis Date  . Anxiety    managed with alprazolam  . Hypothyroidism 06/17/2015  . Vitamin D deficiency 06/11/2011     Patient Active Problem List   Diagnosis Date Noted  . MTHFR gene mutation (HCC) 09/06/2016  . Hypothyroidism 06/17/2015  . Rectal bleeding 06/17/2015  . Fatigue 01/31/2012  . Overweight (BMI 25.0-29.9) 06/18/2011  . Vitamin D deficiency 06/11/2011  . Anxiety     Past Surgical History:  Procedure Laterality Date  . COLONOSCOPY WITH PROPOFOL N/A 08/16/2016   Procedure: COLONOSCOPY WITH PROPOFOL;  Surgeon: Midge Minium, MD;  Location: [facility];  Service: Gastroenterology;  Laterality: N/A;  . ENDOMETRIAL BIOPSY  01/2010   for spotting, biopsy normal (done in MD office)  . WISDOM TOOTH EXTRACTION  Family History        Family Status  Relation Name Status  . Mother  Deceased  . Father  Deceased  . Sister  (Not Specified)  . Neg Hx  (Not Specified)        Her family history includes Alcohol abuse in her mother; Diabetes in her father; Heart disease in her father and sister. There is no history of Breast cancer.      Allergies  Allergen Reactions  . Levaquin [Levofloxacin] Nausea Only    Dizziness     Current Outpatient Medications:  .  acetaminophen (TYLENOL) 325 MG tablet, Take 1 tablet by mouth as needed., Disp: , Rfl:  .  ARMOUR THYROID 30 MG tablet, TAKE ONE TABLET EVERY DAY BEFORE BREAKFAST, Disp: 90 tablet, Rfl: 1 .  pseudoephedrine (SUDAFED) 30 MG tablet,  Take 30 mg by mouth every 4 (four) hours as needed for congestion., Disp: , Rfl:    Patient Care Team: Rubye Beach as PCP - General (Family Medicine)    Objective:    Vitals: BP 118/72 (BP Location: Left Arm, Patient Position: Sitting, Cuff Size: Large)   Pulse 65   Temp (!) 97.3 F (36.3 C) (Other (Comment))   Resp 16   Ht 5' (1.524 m)   Wt 154 lb (69.9 kg)   BMI 30.08 kg/m    Vitals:   10/05/18 0909  BP: 118/72  Pulse: 65  Resp: 16  Temp: (!) 97.3 F (36.3 C)  TempSrc: Other (Comment)  Weight: 154 lb (69.9 kg)  Height: 5' (1.524 m)     Physical Exam Vitals signs reviewed.  Constitutional:      General: She is not in acute distress.    Appearance: Normal appearance. She is well-developed. She is obese. She is not ill-appearing or diaphoretic.  HENT:     Head: Normocephalic and atraumatic.     Right Ear: Tympanic membrane, ear canal and external ear normal.     Left Ear: Tympanic membrane, ear canal and external ear normal.     Nose: Nose normal.     Mouth/Throat:     Mouth: Mucous membranes are moist.     Pharynx: Oropharynx is clear. No oropharyngeal exudate.  Eyes:     General: No scleral icterus.       Right eye: No discharge.        Left eye: No discharge.     Extraocular Movements: Extraocular movements intact.     Conjunctiva/sclera: Conjunctivae normal.     Pupils: Pupils are equal, round, and reactive to light.  Neck:     Musculoskeletal: Normal range of motion and neck supple.     Thyroid: No thyromegaly.     Vascular: No carotid bruit or JVD.     Trachea: No tracheal deviation.  Cardiovascular:     Rate and Rhythm: Normal rate and regular rhythm.     Pulses: Normal pulses.     Heart sounds: Normal heart sounds. No murmur. No friction rub. No gallop.   Pulmonary:     Effort: Pulmonary effort is normal. No respiratory distress.     Breath sounds: Normal breath sounds. No wheezing or rales.  Chest:     Chest wall: No tenderness.   Abdominal:     General: Abdomen is flat. Bowel sounds are normal. There is no distension.     Palpations: Abdomen is soft. There is no mass.     Tenderness: There is no abdominal tenderness. There is no  guarding or rebound.  Musculoskeletal: Normal range of motion.        General: No tenderness.     Right lower leg: No edema.     Left lower leg: No edema.  Lymphadenopathy:     Cervical: No cervical adenopathy.  Skin:    General: Skin is warm and dry.     Capillary Refill: Capillary refill takes less than 2 seconds.     Findings: No rash.  Neurological:     General: No focal deficit present.     Mental Status: She is alert and oriented to person, place, and time. Mental status is at baseline.  Psychiatric:        Mood and Affect: Mood normal.        Behavior: Behavior normal.        Thought Content: Thought content normal.        Judgment: Judgment normal.      Depression Screen PHQ 2/9 Scores 10/05/2018 05/25/2017 09/06/2016  PHQ - 2 Score 0 0 0       Assessment & Plan:     Routine Health Maintenance and Physical Exam  Exercise Activities and Dietary recommendations Goals   None      There is no immunization history on file for this patient.  Health Maintenance  Topic Date Due  . HIV Screening  04/27/1970  . TETANUS/TDAP  04/27/1974  . PAP SMEAR-Modifier  06/30/2019  . MAMMOGRAM  10/03/2020  . COLONOSCOPY  08/17/2026  . Hepatitis C Screening  Completed     Discussed health benefits of physical activity, and encouraged her to engage in regular exercise appropriate for her age and condition.    1. Annual physical exam Normal physical exam today. Will check labs as below and f/u pending lab results. If labs are stable and WNL she will not need to have these rechecked for one year at her next annual physical exam. She is to call the office in the meantime if she has any acute issue, questions or concerns. - CBC with Differential/Platelet - Comprehensive  metabolic panel - Hemoglobin A1c - Lipid panel - TSH  2. Hypothyroidism, unspecified type Stable. Continue Armour Thyroid 30mg . Will check labs as below and f/u pending results. - CBC with Differential/Platelet - TSH  3. Vitamin D deficiency H/O this and post menopausal. Will check labs as below and f/u pending results. - CBC with Differential/Platelet - Vitamin D (25 hydroxy)  4. 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 He has multiple health issues, is in a wheelchair and also undergoing treatment for a deep pressure ulcer involving bone that has been treated since May 2019, including 100 days in a facility for IV antibiotics. Will check labs as below and f/u pending results. - CBC with Differential/Platelet - Comprehensive metabolic panel - TSH  5. Screening for HIV without presence of risk factors Will check labs as below and f/u pending results - HIV antibody (with reflex)  6. Class 1 obesity due to excess calories with serious comorbidity and body mass index (BMI) of 30.0 to 30.9 in adult Counseled patient on healthy lifestyle modifications including dieting and exercise.   --------------------------------------------------------------------    Margaretann Loveless, PA-C  [facility]

## 2018-10-04 ENCOUNTER — Ambulatory Visit
Admission: RE | Admit: 2018-10-04 | Discharge: 2018-10-04 | Disposition: A | Discharge status: Home / Self Care | Payer: 59 | Source: Ambulatory Visit | Attending: Physician Assistant | Admitting: Physician Assistant

## 2018-10-04 DIAGNOSIS — Z1231 Encounter for screening mammogram for malignant neoplasm of breast: Secondary | ICD-10-CM | POA: Diagnosis not present

## 2018-10-04 DIAGNOSIS — Z1239 Encounter for other screening for malignant neoplasm of breast: Secondary | ICD-10-CM

## 2018-10-05 ENCOUNTER — Other Ambulatory Visit: Payer: Self-pay

## 2018-10-05 ENCOUNTER — Encounter: Payer: Self-pay | Admitting: Physician Assistant

## 2018-10-05 ENCOUNTER — Ambulatory Visit (INDEPENDENT_AMBULATORY_CARE_PROVIDER_SITE_OTHER): Payer: 59 | Admitting: Physician Assistant

## 2018-10-05 VITALS — BP 118/72 | HR 65 | Temp 97.3°F | Resp 16 | Ht 60.0 in | Wt 154.0 lb

## 2018-10-05 DIAGNOSIS — E039 Hypothyroidism, unspecified: Secondary | ICD-10-CM

## 2018-10-05 DIAGNOSIS — Z0000 Encounter for general adult medical examination without abnormal findings: Secondary | ICD-10-CM

## 2018-10-05 DIAGNOSIS — Z114 Encounter for screening for human immunodeficiency virus [HIV]: Secondary | ICD-10-CM

## 2018-10-05 DIAGNOSIS — Z6830 Body mass index (BMI) 30.0-30.9, adult: Secondary | ICD-10-CM

## 2018-10-05 DIAGNOSIS — E559 Vitamin D deficiency, unspecified: Secondary | ICD-10-CM | POA: Diagnosis not present

## 2018-10-05 DIAGNOSIS — R5382 Chronic fatigue, unspecified: Secondary | ICD-10-CM

## 2018-10-05 DIAGNOSIS — E6609 Other obesity due to excess calories: Secondary | ICD-10-CM

## 2018-10-05 NOTE — Telephone Encounter (Signed)
Pt called asking if some one could get back I touch with her about her labs.

## 2018-10-05 NOTE — Patient Instructions (Signed)

## 2018-10-05 NOTE — Telephone Encounter (Signed)
Answered patients questions

## 2018-10-05 NOTE — Telephone Encounter (Signed)
Can someone please call to see what questions she may have?  Thanks JB

## 2018-10-06 LAB — CBC WITH DIFFERENTIAL/PLATELET
Basophils Absolute: 0.1 10*3/uL (ref 0.0–0.2)
Basos: 2 %
EOS (ABSOLUTE): 0.2 10*3/uL (ref 0.0–0.4)
Eos: 3 %
Hematocrit: 37.4 % (ref 34.0–46.6)
Hemoglobin: 11.9 g/dL (ref 11.1–15.9)
Immature Grans (Abs): 0 10*3/uL (ref 0.0–0.1)
Immature Granulocytes: 0 %
Lymphocytes Absolute: 1.7 10*3/uL (ref 0.7–3.1)
Lymphs: 28 %
MCH: 29.6 pg (ref 26.6–33.0)
MCHC: 31.8 g/dL (ref 31.5–35.7)
MCV: 93 fL (ref 79–97)
Monocytes Absolute: 0.5 10*3/uL (ref 0.1–0.9)
Monocytes: 8 %
Neutrophils Absolute: 3.6 10*3/uL (ref 1.4–7.0)
Neutrophils: 59 %
Platelets: 240 10*3/uL (ref 150–450)
RBC: 4.02 x10E6/uL (ref 3.77–5.28)
RDW: 13.5 % (ref 11.7–15.4)
WBC: 6.1 10*3/uL (ref 3.4–10.8)

## 2018-10-06 LAB — COMPREHENSIVE METABOLIC PANEL
ALT: 7 IU/L (ref 0–32)
AST: 20 IU/L (ref 0–40)
Albumin/Globulin Ratio: 1.5 (ref 1.2–2.2)
Albumin: 4.1 g/dL (ref 3.8–4.8)
Alkaline Phosphatase: 101 IU/L (ref 39–117)
BUN/Creatinine Ratio: 10 — ABNORMAL LOW (ref 12–28)
BUN: 9 mg/dL (ref 8–27)
Bilirubin Total: 0.6 mg/dL (ref 0.0–1.2)
CO2: 23 mmol/L (ref 20–29)
Calcium: 9.5 mg/dL (ref 8.7–10.3)
Chloride: 108 mmol/L — ABNORMAL HIGH (ref 96–106)
Creatinine, Ser: 0.93 mg/dL (ref 0.57–1.00)
GFR calc Af Amer: 76 mL/min/{1.73_m2} (ref >59)
GFR calc non Af Amer: 66 mL/min/{1.73_m2} (ref >59)
Globulin, Total: 2.7 g/dL (ref 1.5–4.5)
Glucose: 86 mg/dL (ref 65–99)
Potassium: 4.3 mmol/L (ref 3.5–5.2)
Sodium: 143 mmol/L (ref 134–144)
Total Protein: 6.8 g/dL (ref 6.0–8.5)

## 2018-10-06 LAB — HEMOGLOBIN A1C
Est. average glucose Bld gHb Est-mCnc: 111 mg/dL
Hgb A1c MFr Bld: 5.5 % (ref 4.8–5.6)

## 2018-10-06 LAB — LIPID PANEL
Chol/HDL Ratio: 3.4 ratio (ref 0.0–4.4)
Cholesterol, Total: 185 mg/dL (ref 100–199)
HDL: 55 mg/dL (ref >39)
LDL Chol Calc (NIH): 114 mg/dL — ABNORMAL HIGH (ref 0–99)
Triglycerides: 87 mg/dL (ref 0–149)
VLDL Cholesterol Cal: 16 mg/dL (ref 5–40)

## 2018-10-06 LAB — TSH: TSH: 1.89 u[IU]/mL (ref 0.450–4.500)

## 2018-10-06 LAB — HIV ANTIBODY (ROUTINE TESTING W REFLEX): HIV Screen 4th Generation wRfx: NONREACTIVE

## 2018-10-06 LAB — VITAMIN D 25 HYDROXY (VIT D DEFICIENCY, FRACTURES): Vit D, 25-Hydroxy: 31.3 ng/mL (ref 30.0–100.0)

## 2018-11-14 ENCOUNTER — Other Ambulatory Visit: Payer: Self-pay | Admitting: Physician Assistant

## 2018-11-14 DIAGNOSIS — E039 Hypothyroidism, unspecified: Secondary | ICD-10-CM

## 2019-05-23 ENCOUNTER — Other Ambulatory Visit: Payer: Self-pay | Admitting: Physician Assistant

## 2019-05-23 DIAGNOSIS — E039 Hypothyroidism, unspecified: Secondary | ICD-10-CM

## 2019-05-23 NOTE — Telephone Encounter (Signed)
Requested Prescriptions  Pending Prescriptions Disp Refills  . ARMOUR THYROID 30 MG tablet [Pharmacy Med Name: ARMOUR THYROID 30 MG TAB] 90 tablet 0    Sig: TAKE ONE TABLET BY MOUTH EVERY DAY BEFORE BREAKFAST     Endocrinology:  Hypothyroid Agents Failed - 05/23/2019  4:45 PM      Failed - TSH needs to be rechecked within 3 months after an abnormal result. Refill until TSH is due.      Failed - Valid encounter within last 12 months    Recent Outpatient Visits          7 months ago Annual physical exam   [facility] [place], Alessandra Bevels, [place]   1 year ago Annual physical exam   [facility] Joy[place]elyn Man M, [place]   2 years ago Seasonal allergic rhinitis due to pollen   [facility], [place]andra Be[place], [place]   2 years ago Acquired hypothyroidism   [facility] [place], [place] Be[place], [place]   2 years ago Establishing care with new doctor, encounter for   [facility], [place] Dike M, PA-C             Passed - TSH in normal range and within 360 days    TSH  Date Value Ref Range Status  10/05/2018 1.890 0.450 - 4.500 uIU/mL Final

## 2019-07-23 ENCOUNTER — Other Ambulatory Visit: Payer: Self-pay | Admitting: Physician Assistant

## 2019-07-23 DIAGNOSIS — E039 Hypothyroidism, unspecified: Secondary | ICD-10-CM

## 2019-10-23 ENCOUNTER — Other Ambulatory Visit: Payer: Self-pay | Admitting: Physician Assistant

## 2019-10-23 DIAGNOSIS — E039 Hypothyroidism, unspecified: Secondary | ICD-10-CM

## 2019-10-23 NOTE — Telephone Encounter (Signed)
Requested Prescriptions  Pending Prescriptions Disp Refills  . ARMOUR THYROID 30 MG tablet [Pharmacy Med Name: ARMOUR THYROID 30 MG TAB] 30 tablet 0    Sig: TAKE ONE TABLET BY MOUTH EVERY DAY BEFORE BREAKFAST     Endocrinology:  Hypothyroid Agents Failed - 10/23/2019  5:27 PM      Failed - TSH needs to be rechecked within 3 months after an abnormal result. Refill until TSH is due.      Failed - TSH in normal range and within 360 days    TSH  Date Value Ref Range Status  10/05/2018 1.890 0.450 - 4.500 uIU/mL Final         Failed - Valid encounter within last 12 months    Recent Outpatient Visits          1 year ago Annual physical exam   [facility], Alessandra Bevel[place], [place]   2 years ago Annual physical exam   [facility] Joycelyn Man M, [place]   2 years ago Seasonal allergic rhinitis due to pollen   [facility], [place]andra Be[place], [place]   2 years ago Acquired hypothyroidism   [facility] [place], Alessandra Bevels, [place]   3 years ago Establishing care with new doctor, encounter for   [facility], Al[place]andra Bevels, [place]             One month courtesy refill with reminder for patient to schedule an office visit for further refill.

## 2019-11-23 ENCOUNTER — Other Ambulatory Visit: Payer: Self-pay | Admitting: Physician Assistant

## 2019-11-23 DIAGNOSIS — E039 Hypothyroidism, unspecified: Secondary | ICD-10-CM

## 2019-11-23 NOTE — Telephone Encounter (Signed)
Attempted to call patient to schedule appointment- no answer and mailbox is full. Courtesy RF has been given last RF- denied RF request

## 2019-12-22 ENCOUNTER — Other Ambulatory Visit: Payer: Self-pay | Admitting: Physician Assistant

## 2019-12-22 DIAGNOSIS — E039 Hypothyroidism, unspecified: Secondary | ICD-10-CM

## 2019-12-22 NOTE — Telephone Encounter (Signed)
Requested medication (s) are due for refill today: yes  Requested medication (s) are on the active medication list: yes  Last refill:  10/23/19 #30 courtesy RF  Future visit scheduled: no  Notes to clinic:  called pt and pt stated that per her last visit, if her TSH was normal she would not have to have blood work also indicates in office note  (09/2018)- please review   Requested Prescriptions  Pending Prescriptions Disp Refills   ARMOUR THYROID 30 MG tablet [Pharmacy Med Name: ARMOUR THYROID 30 MG TAB] 30 tablet 0    Sig: TAKE 1 TABLET BY MOUTH DAILY BEFORE BREAKFAST      Endocrinology:  Hypothyroid Agents Failed - 12/22/2019  9:03 AM      Failed - TSH needs to be rechecked within 3 months after an abnormal result. Refill until TSH is due.      Failed - TSH in normal range and within 360 days    TSH  Date Value Ref Range Status  10/05/2018 1.890 0.450 - 4.500 uIU/mL Final          Failed - Valid encounter within last 12 months    Recent Outpatient Visits           1 year ago Annual physical exam   [facility] [place], Alessandra Bevels, [place]   2 years ago Annual physical exam   [facility] Joycelyn Man M, [place]   2 years ago Seasonal allergic rhinitis due to pollen   [facility], [place]essandra Bevel[place], [place]   2 years ago Acquired hypothyroidism   [facility] [place], Alessandra Bevels, [place]   3 years ago Establishing care with new doctor, encounter for   [facility] Margaretann Loveless, [place]

## 2020-11-19 IMAGING — MG MM DIGITAL SCREENING BILAT W/ TOMO W/ CAD
8 series · 8 of 24 positions shown · non-contrast
Comparison: Previous exam(s).

CLINICAL DATA: Screening.

EXAM:
DIGITAL SCREENING BILATERAL MAMMOGRAM WITH TOMO AND CAD

[L MLO synth-2D]
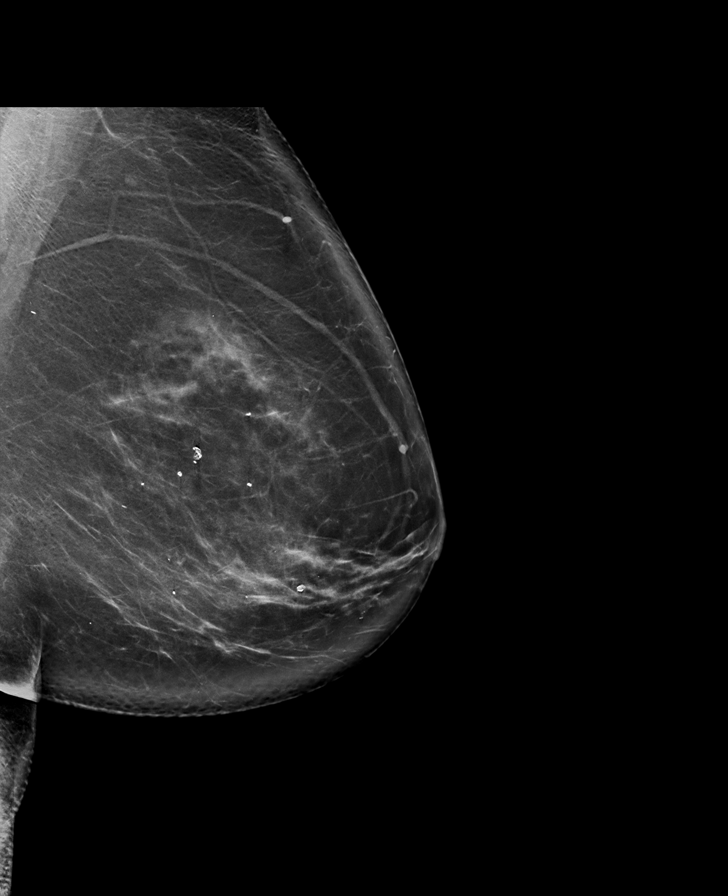

[R CC synth-2D]
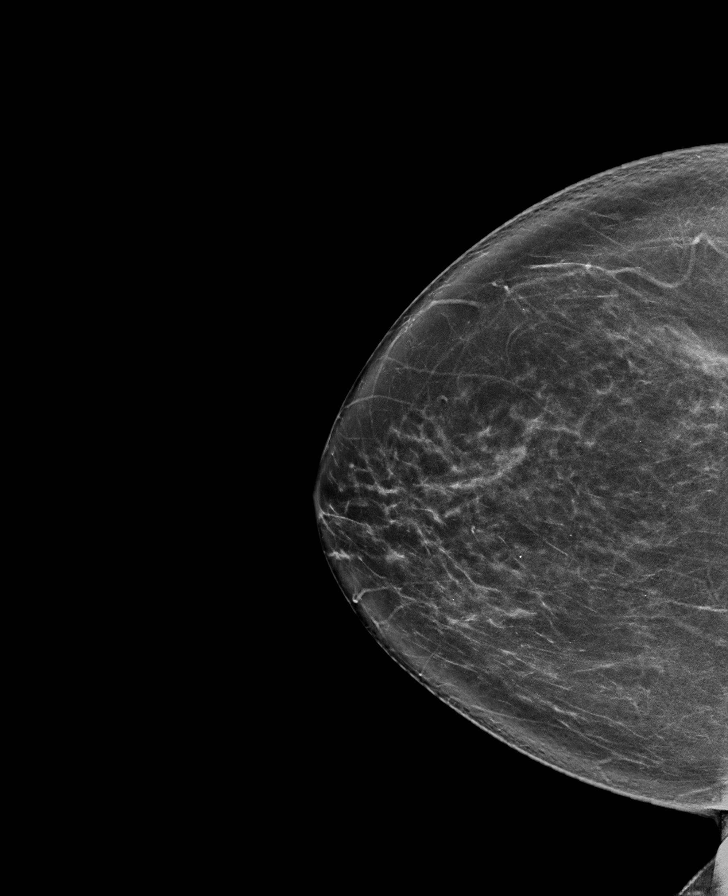

[L CC synth-2D]
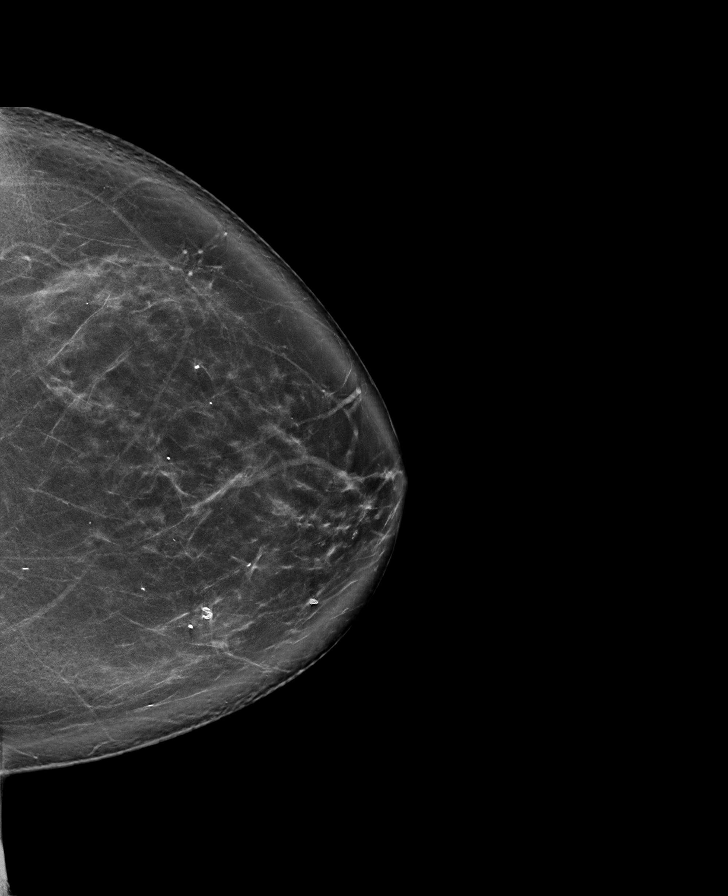

[R MLO synth-2D]
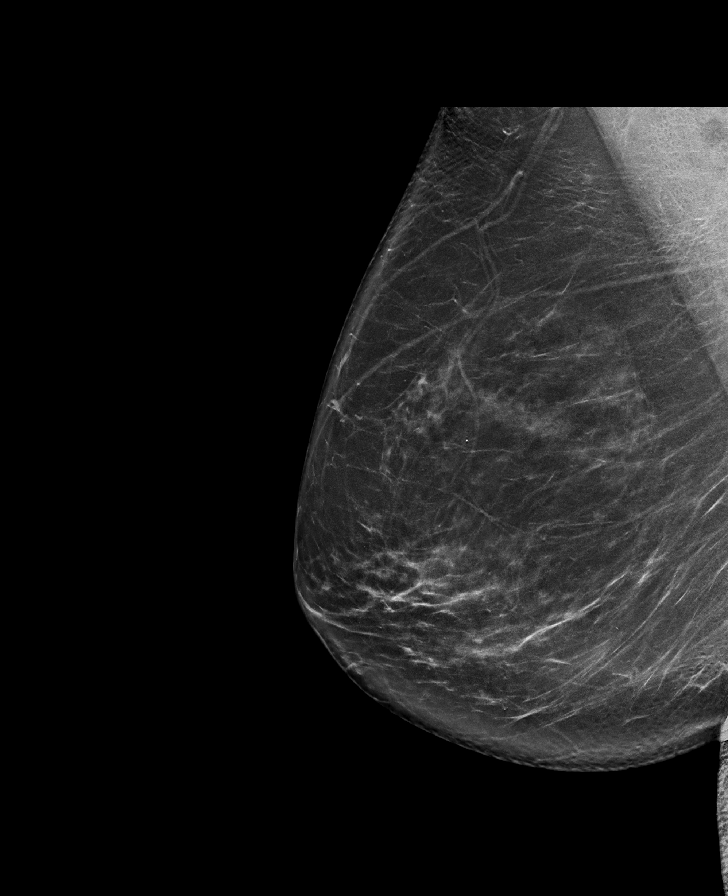

[R MLO tomo · tomo slice 46/91.0]
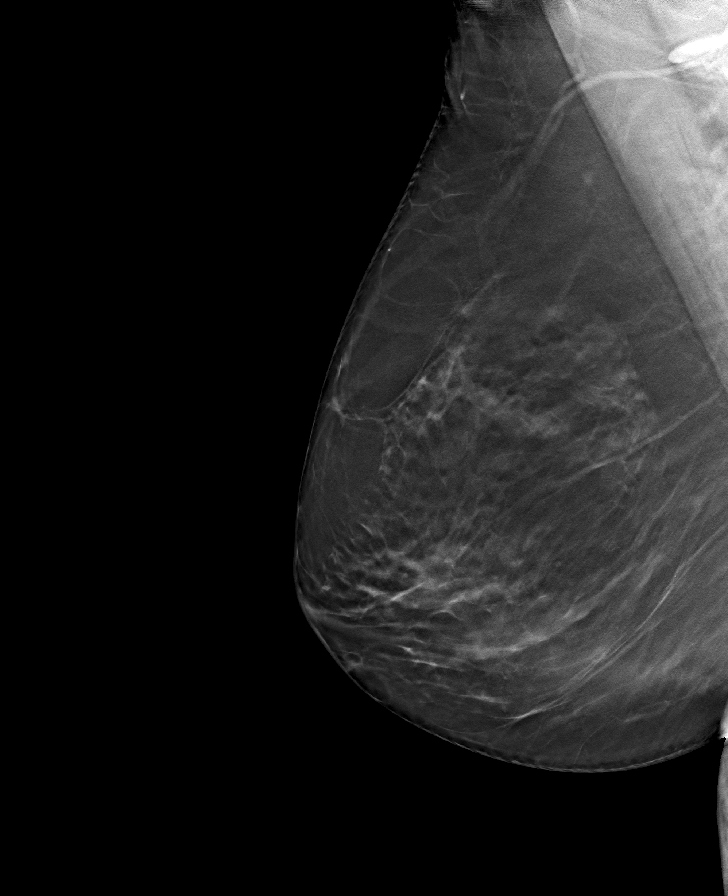

[L MLO tomo · tomo slice 51/100.0]
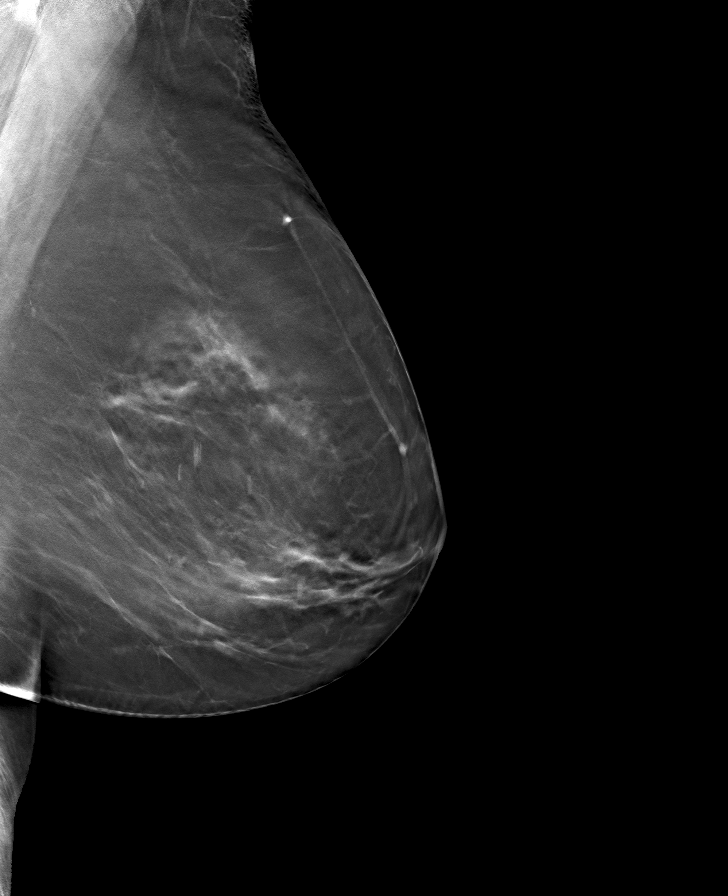

[R CC tomo · tomo slice 42/83.0]
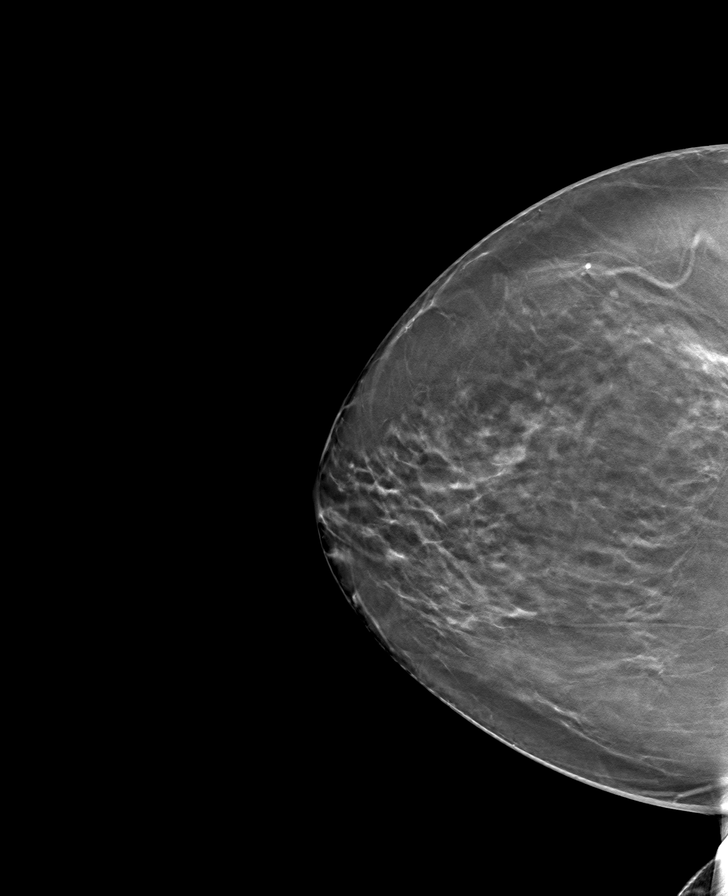

[L CC tomo · tomo slice 49/98.0]
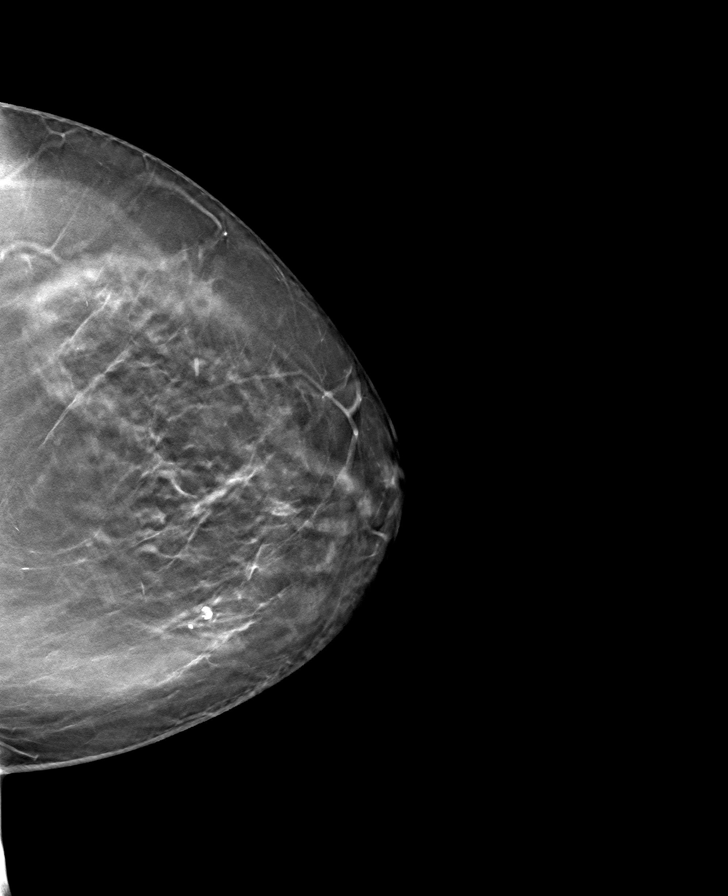

[8 of 24 positions shown; findings below may reference images not displayed]

ACR Breast Density Category b: There are scattered areas of
fibroglandular density.
FINDINGS: There are no findings suspicious for malignancy. Images were
processed with CAD.
IMPRESSION: No mammographic evidence of malignancy. A result letter of this
screening mammogram will be mailed directly to the patient.

RECOMMENDATION:
Screening mammogram in one year. (Code:CN-U-775)

BI-RADS CATEGORY  1: Negative.

## 2023-03-11 ENCOUNTER — Telehealth: Payer: Self-pay | Admitting: Internal Medicine

## 2023-03-11 NOTE — Telephone Encounter (Signed)
 Copied from CRM [id number]. Topic: Appointments - Appointment Scheduling >> Mar 11, 2023 10:23 AM Theodis Sato wrote: Patient/patient representative is calling to schedule an appointment. Refer to attachments for appointment information. >> Mar 11, 2023 10:52 AM Adele Barthel wrote: Patient contacted office this morning and spoke with agent who scheduled her for first avail appt for a new patient visit. The first avail at that time was 06/03 with Dr. Konrad Dolores. She was calling back in to see if an earlier appt was available, due to concerns with her thyroid medication. She is concerned she will run out of refills prior to her appt. Was unable to find earlier appt but did add to wait list for possible cancellations. Patient was wondering if she could establish care with Dr. Darrick Huntsman, since she is a past patient(Last visit was in 2017), did advise she is not taking new patients at this time.   Requested call back if she could be seen earlier, possibly by Dr. Darrick Huntsman  CB# [phone]

## 2023-06-14 ENCOUNTER — Ambulatory Visit (INDEPENDENT_AMBULATORY_CARE_PROVIDER_SITE_OTHER): Payer: 59 | Admitting: Nurse Practitioner

## 2023-06-14 VITALS — BP 120/72 | HR 65 | Temp 98.1°F | Ht 59.0 in | Wt 139.4 lb

## 2023-06-14 DIAGNOSIS — H9313 Tinnitus, bilateral: Secondary | ICD-10-CM

## 2023-06-14 DIAGNOSIS — E039 Hypothyroidism, unspecified: Secondary | ICD-10-CM | POA: Diagnosis not present

## 2023-06-14 DIAGNOSIS — Z13220 Encounter for screening for lipoid disorders: Secondary | ICD-10-CM

## 2023-06-14 DIAGNOSIS — E559 Vitamin D deficiency, unspecified: Secondary | ICD-10-CM | POA: Diagnosis not present

## 2023-06-14 MED ORDER — THYROID 30 MG PO TABS: 30.0000 mg | ORAL_TABLET | Freq: Every day | ORAL | 3 refills | Status: AC

## 2023-06-14 NOTE — Patient Instructions (Addendum)
 It was nice to meet you.  We will contact you with your lab results.   Please let me know when you are interested in having a DEXA (bone density scan) as we discussed today and I will place the order for you.

## 2023-06-14 NOTE — Progress Notes (Signed)
 Leron Glance, NP-C Phone: [phone]  Alicia Brady is a 68 y.o. female who presents today to establish care.   HYPOTHYROIDISM Disease Monitoring Weight changes: No  Skin Changes: No Palpitations: No Heat/Cold intolerance: No Medication Monitoring Compliance:  Armour Thyroid  30 mg daily    Last TSH:   Lab Results  Component Value Date   TSH 1.96 06/14/2023   Patient has a history of chronic tinnitus. She has been evaluated by ENT. Has been ongoing for at least 2 months. Worse at night. Recently improving but has not resolved. She has been using Flonase nasal spray daily as prescribed by ENT.   She is unsure of her last mammogram, last in chart from 2020, she believes she has had one since and will check her records at home. Aged out of pap smears. Last colonoscopy in 2018 with a 10 year recall. Not interested in DEXA scan at this time. She recently completed some lab work, reviewed and scanned into chart.   Active Ambulatory Problems    Diagnosis Date Noted   Anxiety    Vitamin D  deficiency 06/11/2011   Overweight (BMI 25.0-29.9) 06/18/2011   Fatigue 01/31/2012   Hypothyroidism 06/17/2015   Rectal bleeding 06/17/2015   MTHFR gene mutation 09/06/2016   Tinnitus of both ears 06/14/2023   Resolved Ambulatory Problems    Diagnosis Date Noted   Vitamin D  deficiency    Obesity (BMI 30-39.9)    Abdominal pain, epigastric 06/11/2011   Screening for colon cancer 06/11/2011   Acute URI 01/24/2012   Eyelid dermatitis, allergic/contact 02/14/2014   Special screening for malignant neoplasms, colon    No Additional Past Medical History    Family History  Problem Relation Age of Onset   Alcohol abuse Mother    Diabetes Father    Heart disease Father    Heart disease Sister    Breast cancer Neg Hx     Social History   Socioeconomic History   Marital status: Married    Spouse name: Not on file   Number of children: Not on file   Years of education: Not on file    Highest education level: Not on file  Occupational History   Not on file  Tobacco Use   Smoking status: Never   Smokeless tobacco: Never  Substance and Sexual Activity   Alcohol use: No    Alcohol/week: 0.0 standard drinks of alcohol   Drug use: No   Sexual activity: Never  Other Topics Concern   Not on file  Social History Narrative   Not on file   Social Drivers of Health   Financial Resource Strain: Not on file  Food Insecurity: Not on file  Transportation Needs: Not on file  Physical Activity: Not on file  Stress: Not on file  Social Connections: Not on file  Intimate Partner Violence: Not on file    ROS  General:  Negative for unexplained weight loss, fever Skin: Negative for new or changing mole, sore that won't heal HEENT: Negative for trouble hearing, trouble seeing, mouth sores, hoarseness, change in voice, dysphagia. CV:  Negative for chest pain, dyspnea, edema, palpitations Resp: Negative for cough, dyspnea, hemoptysis GI: Negative for nausea, vomiting, diarrhea, constipation, abdominal pain, melena, hematochezia. GU: Negative for dysuria, incontinence, urinary hesitance, hematuria, vaginal or penile discharge, polyuria, sexual difficulty, lumps in testicle or breasts MSK: Negative for muscle cramps or aches, joint pain or swelling Neuro: Negative for headaches, weakness, numbness, dizziness, passing out/fainting Psych: Negative for depression, anxiety,  memory problems  Objective  Physical Exam Vitals:   06/14/23 1441  BP: 120/72  Pulse: 65  Temp: 98.1 F (36.7 C)  SpO2: 96%    BP Readings from Last 3 Encounters:  06/14/23 120/72  10/05/18 118/72  05/25/17 110/78   Wt Readings from Last 3 Encounters:  06/14/23 139 lb 7.2 oz (63.3 kg)  10/05/18 154 lb (69.9 kg)  05/25/17 147 lb (66.7 kg)    Physical Exam Constitutional:      General: She is not in acute distress.    Appearance: Normal appearance.  HENT:     Head: Normocephalic.    Cardiovascular:     Rate and Rhythm: Normal rate and regular rhythm.     Heart sounds: Normal heart sounds.  Pulmonary:     Effort: Pulmonary effort is normal.     Breath sounds: Normal breath sounds.   Skin:    General: Skin is warm and dry.   Neurological:     General: No focal deficit present.     Mental Status: She is alert.   Psychiatric:        Mood and Affect: Mood normal.        Behavior: Behavior normal.      Assessment/Plan:   Acquired hypothyroidism Assessment & Plan: Chronic issue. Taking Armour Thyroid  30 mg daily. Last TSH in chart from 2020. We will check TSH today. Continue current Armour Thyroid  dose, refills provided. We will adjust if necessary, pending lab results.   Orders: -     TSH -     Thyroid ; Take 1 tablet (30 mg total) by mouth daily before breakfast.  Dispense: 90 tablet; Refill: 3  Tinnitus of both ears Assessment & Plan: Occurring since April. Seeing ENT. Using Flonase nasal spray daily. B12 level WNL. Check CMP today. Continue Flonase. Follow up with ENT as scheduled.   Orders: -     Comprehensive metabolic panel with GFR  Vitamin D  deficiency Assessment & Plan: Chronic issue. Occasionally takes OTC vitamin D  supplement, not consistent. Not interested in DEXA scan at this time. Recent vitamin D  level in April mildly decreased. Recommend Vitamin D3 1000 units daily. We will plan to recheck level in the future.    Lipid screening -     Lipid panel    Return in about 6 months (around 12/14/2023) for Follow up.   Leron Glance, NP-C [facility]

## 2023-06-15 LAB — COMPREHENSIVE METABOLIC PANEL WITH GFR
ALT: 9 U/L (ref 0–35)
AST: 22 U/L (ref 0–37)
Albumin: 4.3 g/dL (ref 3.5–5.2)
Alkaline Phosphatase: 66 U/L (ref 39–117)
BUN: 12 mg/dL (ref 6–23)
CO2: 26 meq/L (ref 19–32)
Calcium: 9.3 mg/dL (ref 8.4–10.5)
Chloride: 105 meq/L (ref 96–112)
Creatinine, Ser: 0.94 mg/dL (ref 0.40–1.20)
GFR: 62.51 mL/min (ref >60.00)
Glucose, Bld: 78 mg/dL (ref 70–99)
Potassium: 3.6 meq/L (ref 3.5–5.1)
Sodium: 140 meq/L (ref 135–145)
Total Bilirubin: 1.1 mg/dL (ref 0.2–1.2)
Total Protein: 7.5 g/dL (ref 6.0–8.3)

## 2023-06-15 LAB — LIPID PANEL
Cholesterol: 199 mg/dL (ref 0–200)
HDL: 69.1 mg/dL (ref >39.00)
LDL Cholesterol: 118 mg/dL — ABNORMAL HIGH (ref 0–99)
NonHDL: 130.01
Total CHOL/HDL Ratio: 3
Triglycerides: 60 mg/dL (ref 0.0–149.0)
VLDL: 12 mg/dL (ref 0.0–40.0)

## 2023-06-15 LAB — TSH: TSH: 1.96 u[IU]/mL (ref 0.35–5.50)

## 2023-06-16 ENCOUNTER — Encounter: Payer: Self-pay | Admitting: Nurse Practitioner

## 2023-06-16 ENCOUNTER — Other Ambulatory Visit (HOSPITAL_COMMUNITY): Payer: Self-pay

## 2023-06-16 ENCOUNTER — Telehealth: Payer: Self-pay

## 2023-06-16 ENCOUNTER — Ambulatory Visit: Payer: Self-pay | Admitting: Nurse Practitioner

## 2023-06-16 NOTE — Telephone Encounter (Signed)
 Pharmacy Patient Advocate Encounter   Received notification from CoverMyMeds that prior authorization for Armour Thyroid  30MG  tablets  is required/requested.   Insurance verification completed.   The patient is insured through [facility]  [place] .   Per test claim: PA required; PA started via CoverMyMeds. KEY [id number] . Waiting for clinical questions to populate.

## 2023-06-16 NOTE — Telephone Encounter (Signed)
 Called and spoke to pt

## 2023-06-16 NOTE — Telephone Encounter (Signed)
 Copied from CRM [id number]. Topic: Clinical - Lab/Test Results >> Jun 16, 2023  9:51 AM Alicia Brady wrote: Reason for CRM: Patient calling in requesting to go over her lab results.

## 2023-06-17 NOTE — Telephone Encounter (Signed)
 Has the member had an inadequate response to TWO of the following formulary alternatives: levothyroxine tablets, Levoxyl, Levo-T, Synthroid, Euthyrox, Unithroid, liothyronine (generic for Cytomel)?*   TO CONTINUE WITH PA FOR ARMOUR THYROID . I DO NOT SEE ANY WHERE IN CHART IF PT HAS TRIED AND FAILED ANY OF THE OTHER THYROID  MEDS

## 2023-06-17 NOTE — Telephone Encounter (Signed)
 Pt stated she will call Monday to answer the questions she stated she was headed out of town she stated she will reach out to [facility] to see if she even needs the PA and if they see old scripts of medications tried,.

## 2023-06-22 NOTE — Telephone Encounter (Signed)
 Detailed vm left asking pt to CB and update us  on the clinical questions

## 2023-06-30 NOTE — Telephone Encounter (Signed)
 Pharmacy Patient Advocate Encounter  Received notification from [facility] Medicaid that Prior Authorization for Armour Thyroid  30MG  tablets has been DENIED.  See denial reason below. No denial letter attached in CMM. Will attach denial letter to Media tab once received.  Has the member had an inadequate response to TWO of the following formulary alternatives: levothyroxine tablets, Levoxyl, Levo-T, Synthroid, Euthyrox, Unithroid, liothyronine (generic for Cytomel)?*     PA #/Case ID/Reference #: [id number]

## 2023-07-08 ENCOUNTER — Ambulatory Visit: Payer: Self-pay | Admitting: *Deleted

## 2023-07-08 ENCOUNTER — Encounter: Payer: Self-pay | Admitting: Nurse Practitioner

## 2023-07-08 NOTE — Assessment & Plan Note (Signed)
 Chronic issue. Taking Armour Thyroid  30 mg daily. Last TSH in chart from 2020. We will check TSH today. Continue current Armour Thyroid  dose, refills provided. We will adjust if necessary, pending lab results.

## 2023-07-08 NOTE — Telephone Encounter (Signed)
 Copied from CRM [id number]. Topic: Clinical - Lab/Test Results >> Jul 08, 2023  3:12 PM [place] A wrote: Reason for CRM: Patient has further questions regarding results of lab Reason for Disposition  Health Information question, no triage required and triager able to answer question  Answer Assessment - Initial Assessment Questions 1. REASON FOR CALL or QUESTION: What is your reason for calling today? or How can I best help you? or What question do you have that I can help answer?     Calling in for her lab results.   Unable to get into MyChart.  Protocols used: Information Only Call - No Triage-A-AH FYI Only or Action Required?: FYI only for provider.  Patient was last seen in primary care on 06/14/2023 by Gretel App, NP. Called Nurse Triage reporting Results (Lab results message given from App Gretel, NP). Symptoms began today. Interventions attempted: Nothing. Symptoms are: stable.  Triage Disposition: Information or Advice Only Call  Patient/caregiver understands and will follow disposition?: Yes Just needed lab results becaue unable to get into her MyChart.   Message from App Gretel, NP read to her.  No questions.

## 2023-07-08 NOTE — Telephone Encounter (Signed)
 Called and spoke with pt and she was confused she had received a msg from mychart about labs but she already heard the labs she thought something changed

## 2023-07-08 NOTE — Assessment & Plan Note (Signed)
 Occurring since April. Seeing ENT. Using Flonase nasal spray daily. B12 level WNL. Check CMP today. Continue Flonase. Follow up with ENT as scheduled.

## 2023-07-08 NOTE — Assessment & Plan Note (Signed)
 Chronic issue. Occasionally takes OTC vitamin D  supplement, not consistent. Not interested in DEXA scan at this time. Recent vitamin D  level in April mildly decreased. Recommend Vitamin D3 1000 units daily. We will plan to recheck level in the future.

## 2023-10-27 ENCOUNTER — Other Ambulatory Visit (HOSPITAL_COMMUNITY): Payer: Self-pay

## 2023-10-27 ENCOUNTER — Telehealth (HOSPITAL_COMMUNITY): Payer: Self-pay | Admitting: Pharmacy Technician

## 2023-10-27 NOTE — Telephone Encounter (Signed)
 Pharmacy Patient Advocate Encounter   Received notification from CoverMyMeds that prior authorization for Armour Thyroid  30mg  is required/requested.   Insurance verification completed.   The patient is insured through Medicare D [employer].   Called pharmacy b/c the PA was denied in June and the dispense report looks like she only filled it once in January. They said she pays cash and the pa request was a mistake. Archived Key: [id number]

## 2023-12-20 ENCOUNTER — Ambulatory Visit: Admitting: Nurse Practitioner

## 2024-01-26 ENCOUNTER — Encounter: Payer: Self-pay | Admitting: Nurse Practitioner

## 2024-01-26 ENCOUNTER — Ambulatory Visit (INDEPENDENT_AMBULATORY_CARE_PROVIDER_SITE_OTHER): Admitting: Nurse Practitioner

## 2024-01-26 VITALS — BP 120/88 | HR 66 | Temp 98.3°F | Ht 59.0 in | Wt 133.8 lb

## 2024-01-26 DIAGNOSIS — E663 Overweight: Secondary | ICD-10-CM

## 2024-01-26 DIAGNOSIS — Z1231 Encounter for screening mammogram for malignant neoplasm of breast: Secondary | ICD-10-CM

## 2024-01-26 DIAGNOSIS — Z780 Asymptomatic menopausal state: Secondary | ICD-10-CM

## 2024-01-26 DIAGNOSIS — E039 Hypothyroidism, unspecified: Secondary | ICD-10-CM

## 2024-01-26 DIAGNOSIS — H9313 Tinnitus, bilateral: Secondary | ICD-10-CM | POA: Diagnosis not present

## 2024-01-26 NOTE — Assessment & Plan Note (Signed)
 She has difficulty maintaining a healthy diet, with a current diet that is suboptimal due to frequent eating out. No appetite suppressants or weight loss injections are indicated. Recommended a low-carb, high-protein diet and encouraged smaller, more frequent meals. Advised on mindful eating and reducing carbohydrate intake. Encouraged cooking at home more often. Continue regular physical activity, including walking the dog.

## 2024-01-26 NOTE — Patient Instructions (Signed)
 YOUR MAMMOGRAM IS DUE, PLEASE CALL AND GET THIS SCHEDULED! [facility] - call [phone]

## 2024-01-26 NOTE — Assessment & Plan Note (Signed)
 She experiences chronic left ear tinnitus with no identifiable cause. Previous evaluations were normal. Symptoms are bothersome but not significantly impacting her life. No further ENT referral unless symptoms worsen.

## 2024-01-26 NOTE — Progress Notes (Signed)
 " Alicia Glance, NP-C Phone: [phone]  Alicia Brady is a 69 y.o. female who presents today for follow up.   Discussed the use of AI scribe software for clinical note transcription with the patient, who gave verbal consent to proceed.  History of Present Illness   Alicia Brady is a 69 year old female with hypothyroidism who presents for a six-month follow-up.  She is concerned about her thyroid  management and inquires about the frequency of necessary checks. She reports typically having her thyroid  levels checked twice a year. She takes her thyroid  medication first thing in the morning on an empty stomach. She experiences cold chills and has always felt cold, which she attributes to her thyroid  condition. No issues with hair, skin, nails, or heart palpitations.  She experiences ringing in her left ear, a condition she has previously discussed and for which she has seen an ENT specialist. Despite evaluations, the cause remains undetermined, and the ringing persists, although it is sometimes less noticeable.  She mentions a recent incident where her grandson accidentally dropped a thermos bottle on her toe, causing bruising and tenderness on the toenail. This occurred on Saturday, and she describes the toe as 'bruised up there' but believes it is fine.  She discusses her weight concerns, noting a lifelong struggle with dieting. She describes herself as 'not a healthy eater' and finds it challenging to focus on a diet. She has tried various weight loss programs in the past without sustained success. She is not currently on any appetite suppressants and does not engage in routine exercise beyond walking her dog two to three times a day for about ten to fifteen minutes each time.      Tobacco Use History[1]  Medications Ordered Prior to Encounter[2]   ROS see history of present illness  Objective  Physical Exam Vitals:   01/26/24 1354  BP: 120/88  Pulse: 66  Temp: 98.3 F (36.8  C)  SpO2: 96%    BP Readings from Last 3 Encounters:  01/26/24 120/88  06/14/23 120/72  10/05/18 118/72   Wt Readings from Last 3 Encounters:  01/26/24 133 lb 12.8 oz (60.7 kg)  06/14/23 139 lb 7.2 oz (63.3 kg)  10/05/18 154 lb (69.9 kg)    Physical Exam Constitutional:      General: She is not in acute distress.    Appearance: Normal appearance.  HENT:     Head: Normocephalic.  Cardiovascular:     Rate and Rhythm: Normal rate and regular rhythm.     Heart sounds: Normal heart sounds.  Pulmonary:     Effort: Pulmonary effort is normal.     Breath sounds: Normal breath sounds.  Skin:    General: Skin is warm and dry.  Neurological:     General: No focal deficit present.     Mental Status: She is alert.  Psychiatric:        Mood and Affect: Mood normal.        Behavior: Behavior normal.      Assessment/Plan: Please see individual problem list.  Acquired hypothyroidism Assessment & Plan: Her condition is stable with current medication and she has no symptoms. Check TSH today. Continue Armour Thyroid  30 mg daily.   Orders: -     TSH  Tinnitus of both ears Assessment & Plan: She experiences chronic left ear tinnitus with no identifiable cause. Previous evaluations were normal. Symptoms are bothersome but not significantly impacting her life. No further ENT referral unless symptoms worsen.  Overweight (BMI 25.0-29.9) Assessment & Plan: She has difficulty maintaining a healthy diet, with a current diet that is suboptimal due to frequent eating out. No appetite suppressants or weight loss injections are indicated. Recommended a low-carb, high-protein diet and encouraged smaller, more frequent meals. Advised on mindful eating and reducing carbohydrate intake. Encouraged cooking at home more often. Continue regular physical activity, including walking the dog.   Screening mammogram for breast cancer -     3D Screening Mammogram, Left and Right;  Future  Postmenopausal estrogen deficiency -     DG Bone Density; Future     Return in about 6 months (around 07/25/2024) for Follow up.   Alicia Glance, NP-C [facility] [place]    [1]  Social History Tobacco Use  Smoking Status Never  Smokeless Tobacco Never  [2]  Current Outpatient Medications on File Prior to Visit  Medication Sig Dispense Refill   acetaminophen (TYLENOL) 325 MG tablet Take 1 tablet by mouth as needed.     thyroid  (ARMOUR THYROID ) 30 MG tablet Take 1 tablet (30 mg total) by mouth daily before breakfast. 90 tablet 3   No current facility-administered medications on file prior to visit.   "

## 2024-01-26 NOTE — Assessment & Plan Note (Signed)
 Her condition is stable with current medication and she has no symptoms. Check TSH today. Continue Armour Thyroid  30 mg daily.

## 2024-01-27 ENCOUNTER — Ambulatory Visit: Payer: Self-pay | Admitting: Nurse Practitioner

## 2024-01-27 LAB — TSH: TSH: 1.54 u[IU]/mL (ref 0.35–5.50)

## 2024-01-27 NOTE — Telephone Encounter (Signed)
 Copied from CRM [id number]. Topic: Clinical - Lab/Test Results >> Jan 27, 2024 10:48 AM Avram MATSU wrote: Reason for CRM: I relayed result to patient.

## 2024-04-24 ENCOUNTER — Ambulatory Visit

## 2024-07-25 ENCOUNTER — Ambulatory Visit: Admitting: Nurse Practitioner
